# Patient Record
Sex: Male | Born: 1985 | Race: White | Hispanic: No | Marital: Married | Smoking: Former smoker
Health system: Southern US, Community
[De-identification: ages and names within clinical notes are randomized; demographics above are authoritative.]

## PROBLEM LIST (undated history)

## (undated) DIAGNOSIS — F419 Anxiety disorder, unspecified: Secondary | ICD-10-CM

## (undated) HISTORY — DX: Anxiety disorder, unspecified: F41.9

---

## 2014-11-22 ENCOUNTER — Emergency Department: Payer: Self-pay | Admitting: Emergency Medicine

## 2014-11-22 LAB — CBC
HCT: 48 % (ref 40.0–52.0)
HGB: 16.5 g/dL (ref 13.0–18.0)
MCH: 31.2 pg (ref 26.0–34.0)
MCHC: 34.4 g/dL (ref 32.0–36.0)
MCV: 91 fL (ref 80–100)
PLATELETS: 246 10*3/uL (ref 150–440)
RBC: 5.3 10*6/uL (ref 4.40–5.90)
RDW: 13 % (ref 11.5–14.5)
WBC: 8.8 10*3/uL (ref 3.8–10.6)

## 2014-11-22 LAB — COMPREHENSIVE METABOLIC PANEL
ALBUMIN: 3.9 g/dL (ref 3.4–5.0)
Alkaline Phosphatase: 95 U/L
Anion Gap: 6 — ABNORMAL LOW (ref 7–16)
BUN: 10 mg/dL (ref 7–18)
Bilirubin,Total: 0.8 mg/dL (ref 0.2–1.0)
CALCIUM: 8.4 mg/dL — AB (ref 8.5–10.1)
CO2: 24 mmol/L (ref 21–32)
Chloride: 109 mmol/L — ABNORMAL HIGH (ref 98–107)
Creatinine: 1 mg/dL (ref 0.60–1.30)
EGFR (African American): >60
EGFR (Non-African Amer.): >60
GLUCOSE: 98 mg/dL (ref 65–99)
Osmolality: 277 (ref 275–301)
POTASSIUM: 4.1 mmol/L (ref 3.5–5.1)
SGOT(AST): 22 U/L (ref 15–37)
SGPT (ALT): 26 U/L
SODIUM: 139 mmol/L (ref 136–145)
Total Protein: 6.8 g/dL (ref 6.4–8.2)

## 2014-11-22 LAB — DRUG SCREEN, URINE
Amphetamines, Ur Screen: NEGATIVE (ref ?–1000)
BARBITURATES, UR SCREEN: NEGATIVE (ref ?–200)
Benzodiazepine, Ur Scrn: POSITIVE (ref ?–200)
Cannabinoid 50 Ng, Ur ~~LOC~~: POSITIVE (ref ?–50)
Cocaine Metabolite,Ur ~~LOC~~: NEGATIVE (ref ?–300)
MDMA (ECSTASY) UR SCREEN: NEGATIVE (ref ?–500)
Methadone, Ur Screen: NEGATIVE (ref ?–300)
Opiate, Ur Screen: NEGATIVE (ref ?–300)
PHENCYCLIDINE (PCP) UR S: NEGATIVE (ref ?–25)
Tricyclic, Ur Screen: NEGATIVE (ref ?–1000)

## 2014-11-22 LAB — URINALYSIS, COMPLETE
BILIRUBIN, UR: NEGATIVE
BLOOD: NEGATIVE
Bacteria: NONE SEEN
GLUCOSE, UR: NEGATIVE mg/dL (ref 0–75)
KETONE: NEGATIVE
Leukocyte Esterase: NEGATIVE
NITRITE: NEGATIVE
Ph: 6 (ref 4.5–8.0)
Protein: NEGATIVE
RBC,UR: NONE SEEN /HPF (ref 0–5)
SPECIFIC GRAVITY: 1.019 (ref 1.003–1.030)
WBC UR: <1 /HPF (ref 0–5)

## 2016-03-25 IMAGING — CT CT HEAD WITHOUT CONTRAST
1 series · 16 of 30 positions shown, 20 images · non-contrast
Comparison: None.

CLINICAL DATA: Changes in memory, generalized weakness, tremors.
Lethargic speech.

EXAM:
CT HEAD WITHOUT CONTRAST
TECHNIQUE: Contiguous axial images were obtained from the base of the skull
through the vertex without intravenous contrast.

[Series 2: head wo · axial · 0.42mm/px · z∈[+1214,+1358]mm · 16 of 36 slices shown, 20 images]
[im 2/36  brain]
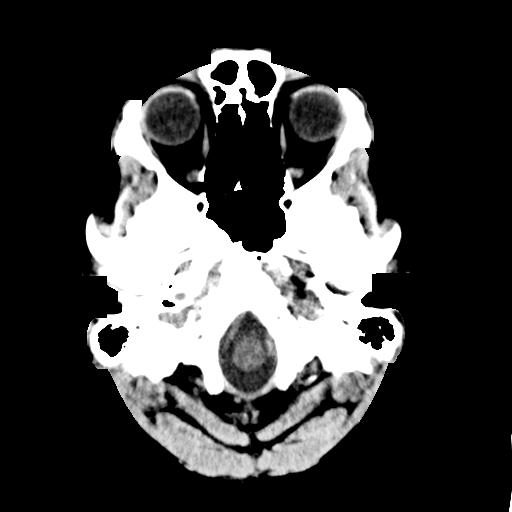
[im 2/36  bone]
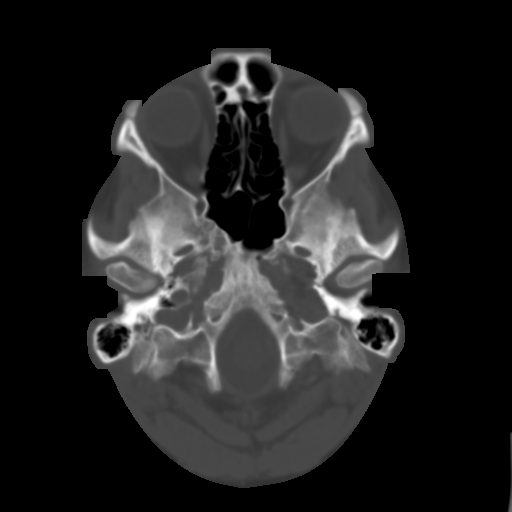
[im 4/36  brain]
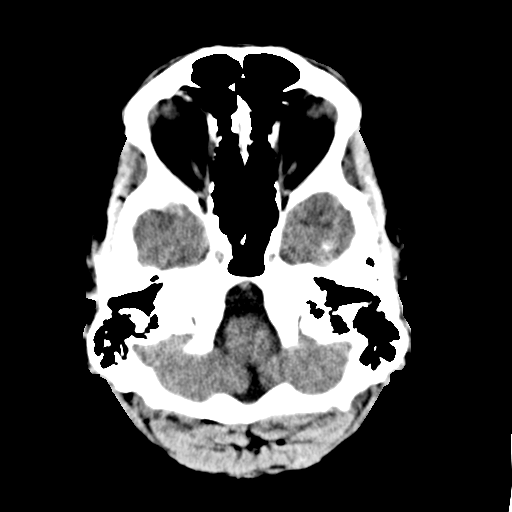
[im 7/36  brain]
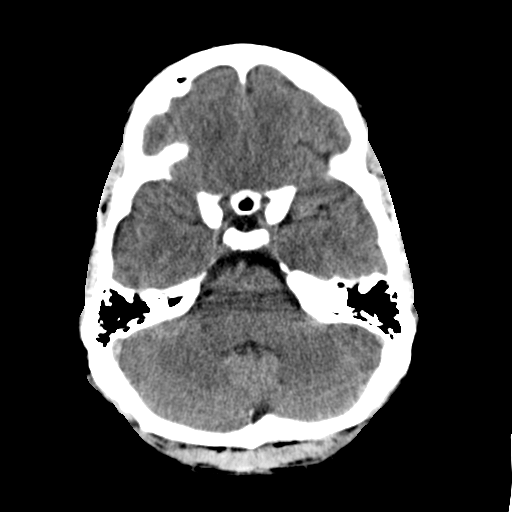
[im 9/36  brain]
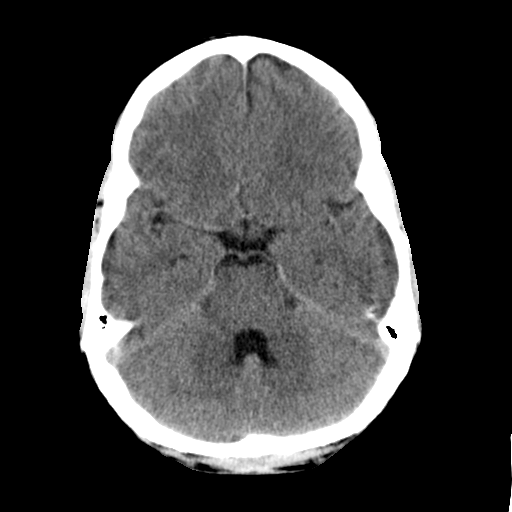
[im 10/36  brain]
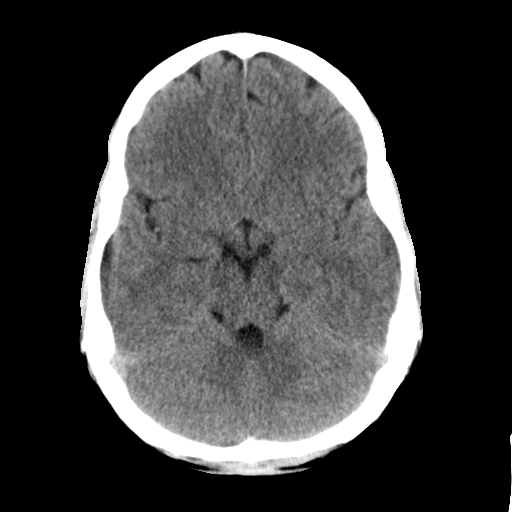
[im 10/36  bone]
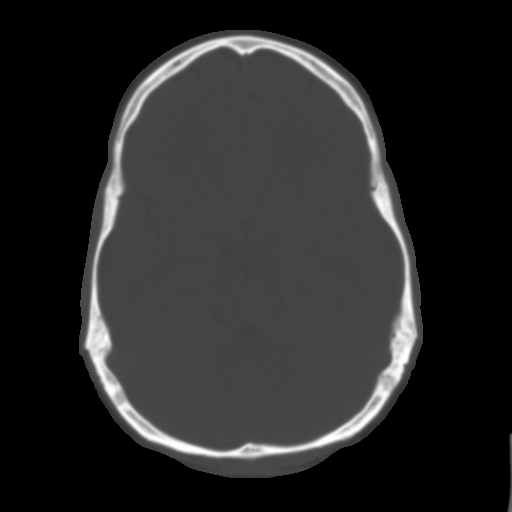
[im 13/36  brain]
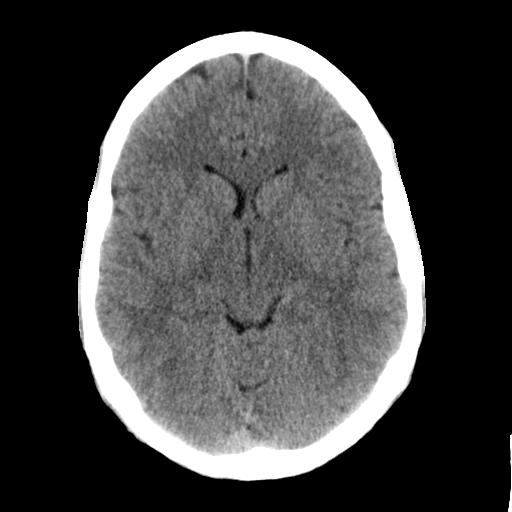
[im 15/36  brain]
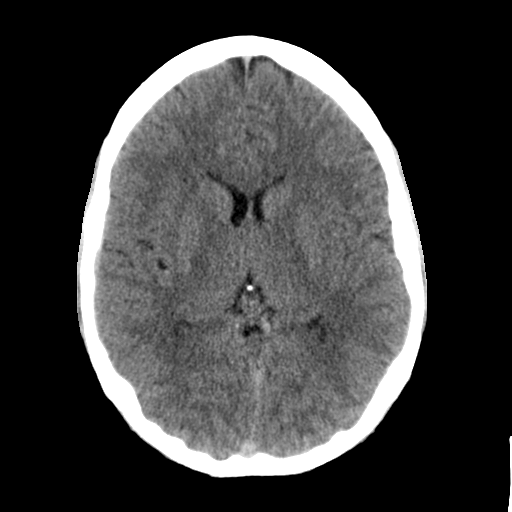
[im 17/36  brain]
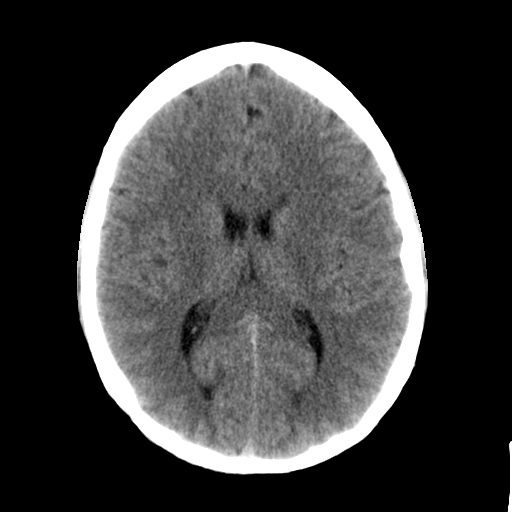
[im 19/36  brain]
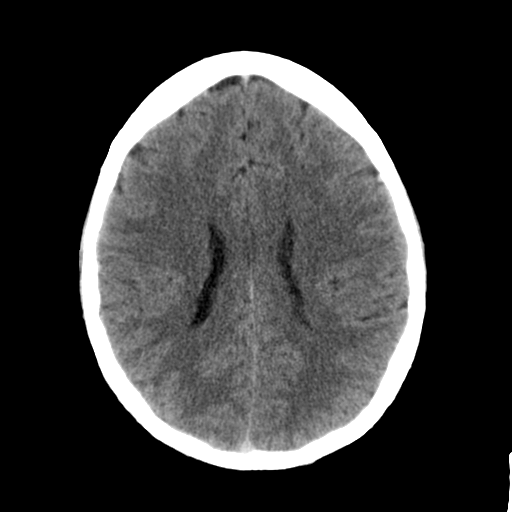
[im 19/36  bone]
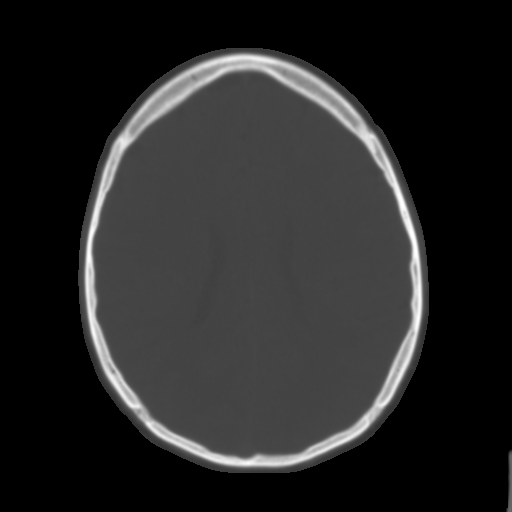
[im 21/36  brain]
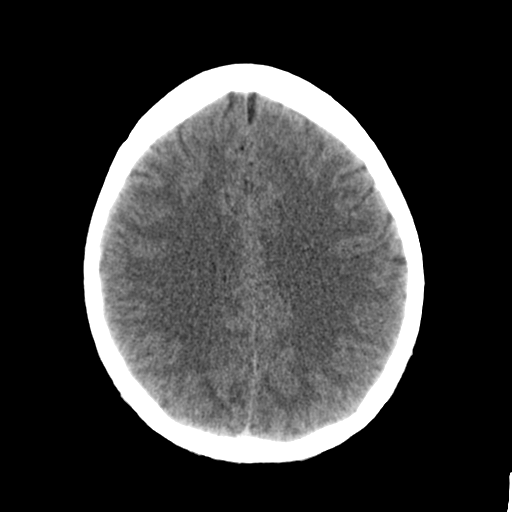
[im 23/36  brain]
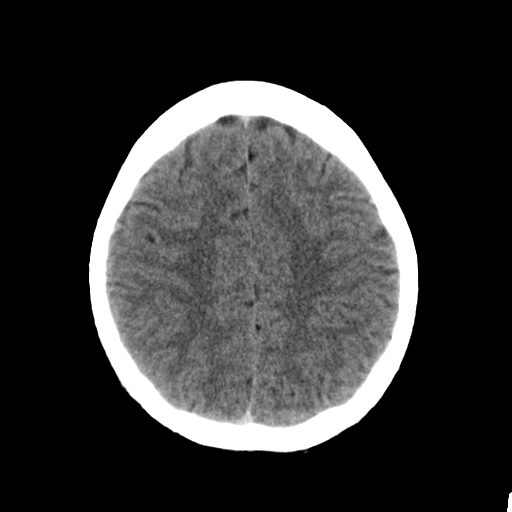
[im 26/36  brain]
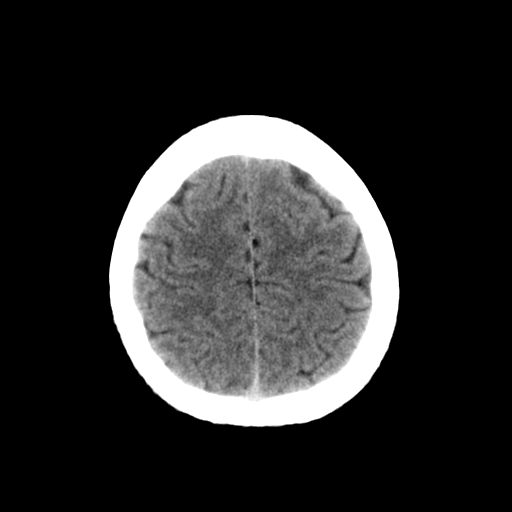
[im 27/36  brain]
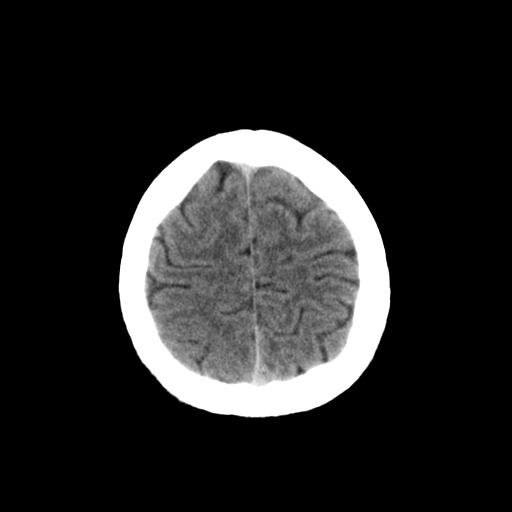
[im 27/36  bone]
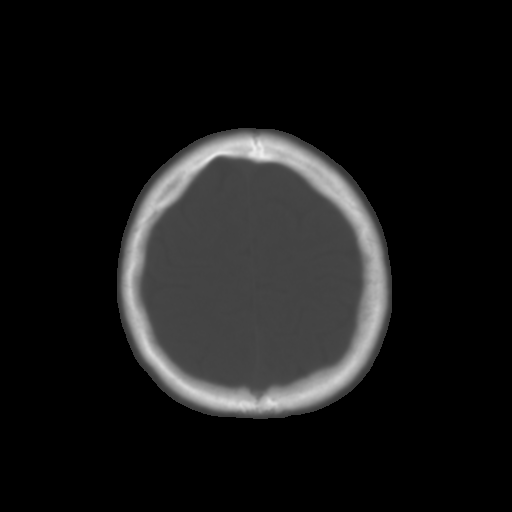
[im 29/36  brain]
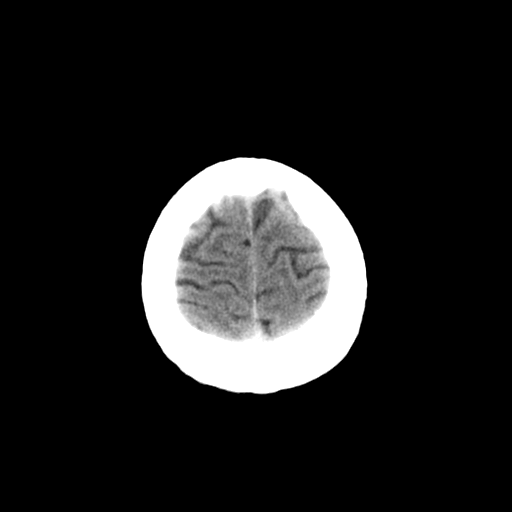
[im 32/36  brain]
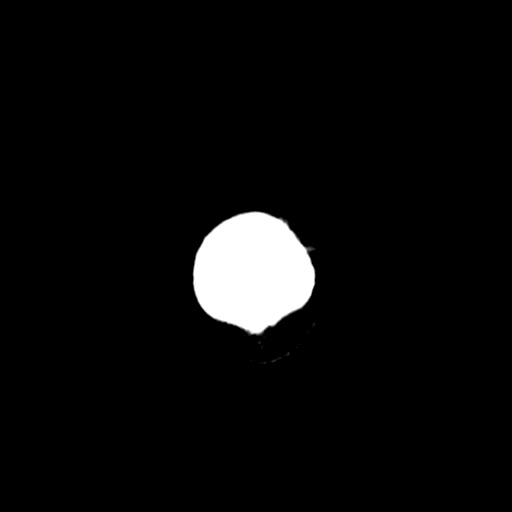
[im 34/36  brain]
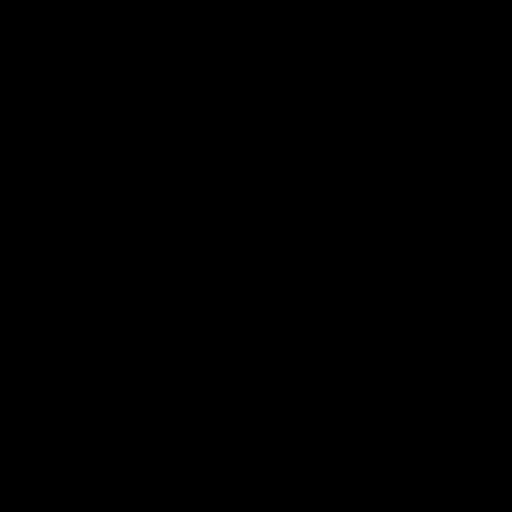

[16 of 30 positions shown; findings below may reference images not displayed]

FINDINGS: Increased attenuation of the intravascular compartment can be seen
with dehydration. No evidence of an acute infarct, acute hemorrhage,
mass lesion, mass effect or hydrocephalus. Visualized portions of
the paranasal sinuses and mastoid air cells are clear.
IMPRESSION: Negative.

## 2020-03-17 ENCOUNTER — Ambulatory Visit: Payer: Self-pay | Attending: Internal Medicine

## 2020-03-17 DIAGNOSIS — Z23 Encounter for immunization: Secondary | ICD-10-CM

## 2020-03-17 NOTE — Progress Notes (Signed)
   Covid-19 Vaccination Clinic  Name:  Scott Duke    MRN: [id number] DOB: 1986/02/12  03/17/2020  Mr. [name] was observed post Covid-19 immunization for 15 minutes without incident. He was provided with Vaccine Information Sheet and instruction to access the V-Safe system.   Mr. [name] was instructed to call 911 with any severe reactions post vaccine: Marland Kitchen Difficulty breathing  . Swelling of face and throat  . A fast heartbeat  . A bad rash all over body  . Dizziness and weakness   Immunizations Administered    Name Date Dose VIS Date Route   Pfizer COVID-19 Vaccine 03/17/2020 10:06 AM 0.3 mL 11/18/2019 Intramuscular   Manufacturer: [employer], [employer]   Lot: [id number]   NDC: [address]-2

## 2020-04-10 ENCOUNTER — Ambulatory Visit: Payer: Self-pay | Attending: Internal Medicine

## 2020-04-10 DIAGNOSIS — Z23 Encounter for immunization: Secondary | ICD-10-CM

## 2020-04-10 NOTE — Progress Notes (Signed)
   Covid-19 Vaccination Clinic  Name:  Danen Lapaglia    MRN: [id number] DOB: 29-Jul-1986  04/10/2020  Mr. Sowle was observed post Covid-19 immunization for 15 minutes without incident. He was provided with Vaccine Information Sheet and instruction to access the V-Safe system.   Mr. Karwowski was instructed to call 911 with any severe reactions post vaccine: Marland Kitchen Difficulty breathing  . Swelling of face and throat  . A fast heartbeat  . A bad rash all over body  . Dizziness and weakness   Immunizations Administered    Name Date Dose VIS Date Route   Pfizer COVID-19 Vaccine 04/10/2020  3:44 PM 0.3 mL 02/01/2019 Intramuscular   Manufacturer: [employer], [employer]   Lot: [id number]   NDC: [address]-3

## 2020-07-08 HISTORY — PX: VASECTOMY: SHX75

## 2020-08-22 ENCOUNTER — Encounter: Payer: Self-pay | Admitting: Nurse Practitioner

## 2020-08-22 ENCOUNTER — Other Ambulatory Visit: Payer: Self-pay

## 2020-08-22 ENCOUNTER — Ambulatory Visit (INDEPENDENT_AMBULATORY_CARE_PROVIDER_SITE_OTHER): Payer: Commercial Managed Care - PPO | Admitting: Nurse Practitioner

## 2020-08-22 VITALS — BP 134/80 | HR 90 | Temp 98.5°F | Ht 69.0 in | Wt 226.0 lb

## 2020-08-22 DIAGNOSIS — E6609 Other obesity due to excess calories: Secondary | ICD-10-CM

## 2020-08-22 DIAGNOSIS — R079 Chest pain, unspecified: Secondary | ICD-10-CM | POA: Diagnosis not present

## 2020-08-22 DIAGNOSIS — Z0000 Encounter for general adult medical examination without abnormal findings: Secondary | ICD-10-CM

## 2020-08-22 DIAGNOSIS — Z6833 Body mass index (BMI) 33.0-33.9, adult: Secondary | ICD-10-CM

## 2020-08-22 DIAGNOSIS — F419 Anxiety disorder, unspecified: Secondary | ICD-10-CM

## 2020-08-22 DIAGNOSIS — R030 Elevated blood-pressure reading, without diagnosis of hypertension: Secondary | ICD-10-CM

## 2020-08-22 DIAGNOSIS — Z114 Encounter for screening for human immunodeficiency virus [HIV]: Secondary | ICD-10-CM

## 2020-08-22 DIAGNOSIS — Z1159 Encounter for screening for other viral diseases: Secondary | ICD-10-CM

## 2020-08-22 DIAGNOSIS — E559 Vitamin D deficiency, unspecified: Secondary | ICD-10-CM

## 2020-08-22 LAB — B12 AND FOLATE PANEL
Folate: 9.5 ng/mL (ref >5.9)
Vitamin B-12: 326 pg/mL (ref 211–911)

## 2020-08-22 LAB — VITAMIN D 25 HYDROXY (VIT D DEFICIENCY, FRACTURES): VITD: 11.48 ng/mL — ABNORMAL LOW (ref 30.00–100.00)

## 2020-08-22 LAB — CBC WITH DIFFERENTIAL/PLATELET
Basophils Absolute: 0.1 10*3/uL (ref 0.0–0.1)
Basophils Relative: 0.8 % (ref 0.0–3.0)
Eosinophils Absolute: 0.2 10*3/uL (ref 0.0–0.7)
Eosinophils Relative: 1.9 % (ref 0.0–5.0)
HCT: 46.6 % (ref 39.0–52.0)
Hemoglobin: 16.2 g/dL (ref 13.0–17.0)
Lymphocytes Relative: 25.3 % (ref 12.0–46.0)
Lymphs Abs: 2.3 10*3/uL (ref 0.7–4.0)
MCHC: 34.8 g/dL (ref 30.0–36.0)
MCV: 88.7 fl (ref 78.0–100.0)
Monocytes Absolute: 0.7 10*3/uL (ref 0.1–1.0)
Monocytes Relative: 8 % (ref 3.0–12.0)
Neutro Abs: 5.8 10*3/uL (ref 1.4–7.7)
Neutrophils Relative %: 64 % (ref 43.0–77.0)
Platelets: 302 10*3/uL (ref 150.0–400.0)
RBC: 5.25 Mil/uL (ref 4.22–5.81)
RDW: 13.1 % (ref 11.5–15.5)
WBC: 9 10*3/uL (ref 4.0–10.5)

## 2020-08-22 LAB — COMPREHENSIVE METABOLIC PANEL
ALT: 29 U/L (ref 0–53)
AST: 18 U/L (ref 0–37)
Albumin: 4.7 g/dL (ref 3.5–5.2)
Alkaline Phosphatase: 117 U/L (ref 39–117)
BUN: 10 mg/dL (ref 6–23)
CO2: 26 mEq/L (ref 19–32)
Calcium: 9.4 mg/dL (ref 8.4–10.5)
Chloride: 106 mEq/L (ref 96–112)
Creatinine, Ser: 1.04 mg/dL (ref 0.40–1.50)
GFR: 82.61 mL/min (ref >60.00)
Glucose, Bld: 83 mg/dL (ref 70–99)
Potassium: 4.1 mEq/L (ref 3.5–5.1)
Sodium: 141 mEq/L (ref 135–145)
Total Bilirubin: 1 mg/dL (ref 0.2–1.2)
Total Protein: 6.8 g/dL (ref 6.0–8.3)

## 2020-08-22 LAB — LIPID PANEL
Cholesterol: 209 mg/dL — ABNORMAL HIGH (ref 0–200)
HDL: 36.8 mg/dL — ABNORMAL LOW (ref >39.00)
NonHDL: 172.44
Total CHOL/HDL Ratio: 6
Triglycerides: 396 mg/dL — ABNORMAL HIGH (ref 0.0–149.0)
VLDL: 79.2 mg/dL — ABNORMAL HIGH (ref 0.0–40.0)

## 2020-08-22 LAB — LDL CHOLESTEROL, DIRECT: Direct LDL: 117 mg/dL

## 2020-08-22 LAB — TSH: TSH: 1.7 u[IU]/mL (ref 0.35–4.50)

## 2020-08-22 LAB — HEMOGLOBIN A1C: Hgb A1c MFr Bld: 5.2 % (ref 4.6–6.5)

## 2020-08-22 NOTE — Progress Notes (Signed)
New Patient Office Visit  Subjective:  Patient ID: Scott Duke, male    DOB: 1986-11-25  Age: 34 y.o. MRN: [id number]  CC:  Chief Complaint  Patient presents with  . New Patient (Initial Visit)    establish care    HPI Scott Duke is a 34 year old who presents to establish care with primary care provider.  He reports he has not has CPE as an adult. He is here by the encouragement of his wife who has been checking his blood pressure at home has been finding it is elevated.  Patient does not know what his blood pressure readings are at home. He reports he had a mild headache for a week which prompted the checks.  He has never been diagnosed with high blood pressure but he does not come to the doctor to know whether it has been elevated or not.  He is trying to take better care of himself.  He quit smoking in July.  BP Readings from Last 3 Encounters:  08/22/20 134/80   Left inferior rib chest pain: He took his wife on a kayaking trip on Sunday-8/12. He  was doing all of the rowing for hours.  He is not used to rowing and he thinks he may have aggravated a muscle in this chest area.  He felt left lower chest rib area discomfort on Monday night, described as a dull ache rated 4 out of 10.  It did not radiate up into the chest or into the back.  He could find any triggers for it and was not positional. He did not take any medication for it. He felt tired Monday and Tuesday.  He had no fevers or chills or cough.  He may have little shortness of breath.  No wheezing.  No exertional component to this.  He works in a [employer] and does heavy lifting and walking all day long and had no problems at work.  No dizziness, lightheadedness, DOE, calf pain or tenderness.  CP resolved after Tues and he has no chest pain now.  He denies shortness of breath or DOE now.  He reports no GI complaints such as heartburn or indigestion.  Anxiety: He has been experiencing irritation at work with stressors.  He has 2 young  children, a 23-1/2-year-old and a 67-month-old and is getting used to being a parent.  He is seeing psychiatry, Dr. Evelene Croon now.  Remote use of Xanax 1 mg and Klonopin in the past.  He  currently presents on no medication. PHQ-9: 2, GAD-7: 10.  No SI or HI.  Tobacco history: Former smoker and quit recently July 20 21 cigarettes.  He vaped in the past. Negative alcohol or illicit drugs.     BMI 33/Obesity:  Wt Readings from Last 3 Encounters:  08/22/20 226 lb (102.5 kg)    History reviewed. No pertinent past medical history.  Past Surgical History:  Procedure Laterality Date  . VASECTOMY  07/2020    Family History  Problem Relation Age of Onset  . Alcohol abuse Mother   . Depression Mother   . Alcohol abuse Father   . Depression Father   . Early death Father   . Mental illness Father   . Depression Sister   . Mental illness Sister   . Depression Brother   . Mental illness Brother   . COPD Maternal Grandmother   . Heart attack Maternal Grandfather     Social History   Socioeconomic History  . Marital status: Married  Spouse name: Not on file  . Number of children: Not on file  . Years of education: Not on file  . Highest education level: High school graduate  Occupational History  . Not on file  Tobacco Use  . Smoking status: Former Smoker    Types: Cigarettes    Quit date: 06/07/2020    Years since quitting: 0.2  . Smokeless tobacco: Never Used  Vaping Use  . Vaping Use: Former  Substance and Sexual Activity  . Alcohol use: Yes  . Drug use: Never  . Sexual activity: Yes    Partners: Female  Other Topics Concern  . Not on file  Social History Narrative   Married works in a [employer]- physical job, 2 young children   Social Determinants of Corporate investment banker Strain:   . Difficulty of Paying Living Expenses: Not on file  Food Insecurity:   . Worried About Programme researcher, broadcasting/film/video in the Last Year: Not on file  . Ran Out of Food in the Last Year: Not on  file  Transportation Needs:   . Lack of Transportation (Medical): Not on file  . Lack of Transportation (Non-Medical): Not on file  Physical Activity:   . Days of Exercise per Week: Not on file  . Minutes of Exercise per Session: Not on file  Stress:   . Feeling of Stress : Not on file  Social Connections:   . Frequency of Communication with Friends and Family: Not on file  . Frequency of Social Gatherings with Friends and Family: Not on file  . Attends Religious Services: Not on file  . Active Member of Clubs or Organizations: Not on file  . Attends Banker Meetings: Not on file  . Marital Status: Not on file  Intimate Partner Violence:   . Fear of Current or Ex-Partner: Not on file  . Emotionally Abused: Not on file  . Physically Abused: Not on file  . Sexually Abused: Not on file    Review of Systems Pertinent positives none history of present illness otherwise negative.  Objective:   Today's Vitals: BP 134/80 (BP Location: Left Arm, Patient Position: Sitting, Cuff Size: Normal)   Pulse 90   Temp 98.5 F (36.9 C) (Oral)   Ht 5\' 9"  (1.753 m)   Wt 226 lb (102.5 kg)   SpO2 98%   BMI 33.37 kg/m   Physical Exam Vitals reviewed.  Constitutional:      Appearance: He is obese.  HENT:     Head: Normocephalic and atraumatic.  Eyes:     Conjunctiva/sclera: Conjunctivae normal.     Pupils: Pupils are equal, round, and reactive to light.  Cardiovascular:     Rate and Rhythm: Normal rate and regular rhythm.     Pulses: Normal pulses.     Heart sounds: Normal heart sounds.  Pulmonary:     Effort: Pulmonary effort is normal.     Breath sounds: Normal breath sounds.  Chest:     Chest wall: No mass, deformity or tenderness.     Comments: Cannot reproduce his chest discomfort with palpation. Twisting motion  Abdominal:     Palpations: Abdomen is soft.     Tenderness: There is no abdominal tenderness.  Musculoskeletal:        General: Normal range of motion.      Cervical back: Normal range of motion and neck supple.  Skin:    General: Skin is warm and dry.  Neurological:  General: No focal deficit present.     Mental Status: He is alert and oriented to person, place, and time.  Psychiatric:        Mood and Affect: Mood normal.        Behavior: Behavior normal.        Thought Content: Thought content normal.        Judgment: Judgment normal.     Assessment & Plan:   Problem List Items Addressed This Visit      Other   Chest pain - Primary   Relevant Orders   EKG 12-Lead (Completed)   Elevated BP without diagnosis of hypertension   Class 1 obesity due to excess calories with body mass index (BMI) of 33.0 to 33.9 in adult   Relevant Orders   CBC with Differential/Platelet (Completed)   TSH (Completed)   Comprehensive metabolic panel (Completed)   Hemoglobin A1c (Completed)   Lipid panel (Completed)   Anxiety   Relevant Orders   VITAMIN D 25 Hydroxy (Vit-D Deficiency, Fractures) (Completed)   B12 and Folate Panel (Completed)   Encounter for HCV screening test for low risk patient   Relevant Orders   Hepatitis C antibody (Completed)      No outpatient encounter medications on file as of 08/22/2020.   No facility-administered encounter medications on file as of 08/22/2020.   EKG was performed today because of his report of chest pain and mild SOB.  The EKG was read by me and reviewed by Dr.Scott:  showed sinus rhythm with ventricular rate of 76.  PR: 134 ms, QT 360 ms, QTC P: 405 ms T wave inversion in lead III, flattened T waves in aVF, nonspecific ST-T wave changes.   Patient advised:  Please go to the lab today for routine laboratory studies.Great job quitting smoking in July!  Please spot check your blood pressure couple times a week and record.  You can bring those into the next office visit.  A diagnosis of hypertension is made when there is persistent elevation in the blood pressure. Check your blood pressure with both  feet on the floor, awake 30 minutes after eating, have a quiet time of 3 to 5 minutes before you check the blood pressure.   You may take Tylenol as needed for the left-sided chest pain.  Avoid ibuprofen, Advil, Aleve, BC daily, Motrin.  This is called the nonsteroidal anti-inflammatory class of medicines and they are known to elevate the blood pressure and harm the kidneys if taken on a regular basis 6 or more times per month  Follow-up with your psychiatrist as you have planned for anxiety.  Follow-up: Return in about 4 weeks (around 09/19/2020).  This visit occurred during the SARS-CoV-2 public health emergency.  Safety protocols were in place, including screening questions prior to the visit, additional usage of staff PPE, and extensive cleaning of exam room while observing appropriate contact time as indicated for disinfecting solutions.   [name], NP

## 2020-08-22 NOTE — Patient Instructions (Addendum)
Please go to the lab today for routine laboratory studies.Great job quitting smoking in July!  Please spot check your blood pressure couple times a week and record.  You can bring those into the next office visit.  A diagnosis of hypertension is made when there is persistent elevation in the blood pressure. Check your blood pressure with both feet on the floor, awake 30 minutes after eating, have a quiet time of 3 to 5 minutes before you check the blood pressure.   EKG was performed today because of your report of chest pain. You may take Tylenol as needed for the left-sided chest pain.  Avoid ibuprofen, Advil, Aleve, BC daily, Motrin.  This is called the nonsteroidal anti-inflammatory class of medicines and they are known to elevate the blood pressure and harm the kidneys if taken on a regular basis 6 or more times per month  Follow-up with your psychiatrist as you have planned for anxiety.  Please arrange follow-up office visit in 1 month and will do a complete physical exam.  Chest Wall Pain Chest wall pain is pain in or around the bones and muscles of your chest. Sometimes, an injury causes this pain. Excessive coughing or overuse of arm and chest muscles may also cause chest wall pain. Sometimes, the cause may not be known. This pain may take several weeks or longer to get better. Follow these instructions at home: Managing pain, stiffness, and swelling   If directed, put ice on the painful area: ? Put ice in a plastic bag. ? Place a towel between your skin and the bag. ? Leave the ice on for 20 minutes, 2-3 times per day. Activity  Rest as told by your health care provider.  Avoid activities that cause pain. These include any activities that use your chest muscles or your abdominal and side muscles to lift heavy items. Ask your health care provider what activities are safe for you. General instructions   Take over-the-counter and prescription medicines only as told by your health  care provider.  Do not use any products that contain nicotine or tobacco, such as cigarettes, e-cigarettes, and chewing tobacco. These can delay healing after injury. If you need help quitting, ask your health care provider.  Keep all follow-up visits as told by your health care provider. This is important. Contact a health care provider if:  You have a fever.  Your chest pain becomes worse.  You have new symptoms. Get help right away if:  You have nausea or vomiting.  You feel sweaty or light-headed.  You have a cough with mucus from your lungs (sputum) or you cough up blood.  You develop shortness of breath. These symptoms may represent a serious problem that is an emergency. Do not wait to see if the symptoms will go away. Get medical help right away. Call your local emergency services (911 in the [place] Do not drive yourself to the hospital. Summary  Chest wall pain is pain in or around the bones and muscles of your chest.  Depending on the cause, it may be treated with ice, rest, medicines, and avoiding activities that cause pain.  Contact a health care provider if you have a fever, worsening chest pain, or new symptoms.  Get help right away if you feel light-headed or you develop shortness of breath. These symptoms may be an emergency. This information is not intended to replace advice given to you by your health care provider. Make sure you discuss any questions you have  with your health care provider. Document Revised: 05/27/2018 Document Reviewed: 05/27/2018 Elsevier Patient Education  [place].  Preventing Hypertension Hypertension, commonly called high blood pressure, is when the force of blood pumping through the arteries is too strong. Arteries are blood vessels that carry blood from the heart throughout the body. Over time, hypertension can damage the arteries and decrease blood flow to important parts of the body, including the brain, heart, and kidneys.  Often, hypertension does not cause symptoms until blood pressure is very high. For this reason, it is important to have your blood pressure checked on a regular basis. Hypertension can often be prevented with diet and lifestyle changes. If you already have hypertension, you can control it with diet and lifestyle changes, as well as medicine. What nutrition changes can be made? Maintain a healthy diet. This includes:  Eating less salt (sodium). Ask your health care provider how much sodium is safe for you to have. The general recommendation is to consume less than 1 tsp (2,300 mg) of sodium a day. ? Do not add salt to your food. ? Choose low-sodium options when grocery shopping and eating out.  Limiting fats in your diet. You can do this by eating low-fat or fat-free dairy products and by eating less red meat.  Eating more fruits, vegetables, and whole grains. Make a goal to eat: ? 1-2 cups of fresh fruits and vegetables each day. ? 3-4 servings of whole grains each day.  Avoiding foods and beverages that have added sugars.  Eating fish that contain healthy fats (omega-3 fatty acids), such as mackerel or salmon. If you need help putting together a healthy eating plan, try the DASH diet. This diet is high in fruits, vegetables, and whole grains. It is low in sodium, red meat, and added sugars. DASH stands for Dietary Approaches to Stop Hypertension. What lifestyle changes can be made?   Lose weight if you are overweight. Losing just 3?5% of your body weight can help prevent or control hypertension. ? For example, if your present weight is 200 lb (91 kg), a loss of 3-5% of your weight means losing 6-10 lb (2.7-4.5 kg). ? Ask your health care provider to help you with a diet and exercise plan to safely lose weight.  Get enough exercise. Do at least 150 minutes of moderate-intensity exercise each week. ? You could do this in short exercise sessions several times a day, or you could do longer  exercise sessions a few times a week. For example, you could take a brisk 10-minute walk or bike ride, 3 times a day, for 5 days a week.  Find ways to reduce stress, such as exercising, meditating, listening to music, or taking a yoga class. If you need help reducing stress, ask your health care provider.  Do not smoke. This includes e-cigarettes. Chemicals in tobacco and nicotine products raise your blood pressure each time you smoke. If you need help quitting, ask your health care provider.  Avoid alcohol. If you drink alcohol, limit alcohol intake to no more than 1 drink a day for nonpregnant women and 2 drinks a day for men. One drink equals 12 oz of beer, 5 oz of wine, or 1 oz of hard liquor. Why are these changes important? Diet and lifestyle changes can help you prevent hypertension, and they may make you feel better overall and improve your quality of life. If you have hypertension, making these changes will help you control it and help prevent major complications,  such as:  Hardening and narrowing of arteries that supply blood to: ? Your heart. This can cause a heart attack. ? Your brain. This can cause a stroke. ? Your kidneys. This can cause kidney failure.  Stress on your heart muscle, which can cause heart failure. What can I do to lower my risk?  Work with your health care provider to make a hypertension prevention plan that works for you. Follow your plan and keep all follow-up visits as told by your health care provider.  Learn how to check your blood pressure at home. Make sure that you know your personal target blood pressure, as told by your health care provider. How is this treated? In addition to diet and lifestyle changes, your health care provider may recommend medicines to help lower your blood pressure. You may need to try a few different medicines to find what works best for you. You also may need to take more than one medicine. Take over-the-counter and prescription  medicines only as told by your health care provider. Where to find support Your health care provider can help you prevent hypertension and help you keep your blood pressure at a healthy level. Your local hospital or your community may also provide support services and prevention programs. The American Heart Association offers an online support network at: CheapBootlegs.com.cy Where to find more information Learn more about hypertension from:  [place], Lung, and Blood Institute: ElectronicHangman.is  Centers for Disease Control and Prevention: https://ingram.com/  American Academy of Family Physicians: http://familydoctor.org/familydoctor/en/diseases-conditions/high-blood-pressure.printerview.all.html Learn more about the DASH diet from:  [place], Lung, and [place]: https://www.reyes.com/ Contact a health care provider if:  You think you are having a reaction to medicines you have taken.  You have recurrent headaches or feel dizzy.  You have swelling in your ankles.  You have trouble with your vision. Summary  Hypertension often does not cause any symptoms until blood pressure is very high. It is important to get your blood pressure checked regularly.  Diet and lifestyle changes are the most important steps in preventing hypertension.  By keeping your blood pressure in a healthy range, you can prevent complications like heart attack, heart failure, stroke, and kidney failure.  Work with your health care provider to make a hypertension prevention plan that works for you. This information is not intended to replace advice given to you by your health care provider. Make sure you discuss any questions you have with your health care provider. Document Revised: 03/18/2019 Document Reviewed: 08/04/2016 Elsevier Patient Education  2020 [place] With  Anxiety Anxiety disorders are mental health conditions that cause overwhelming feelings of nervousness or worry. These feelings interfere with daily activities and relationships. Anxiety disorders include:  Generalized anxiety disorder (GAD).  Social anxiety.  Post-traumatic stress disorder (PTSD). When a person has an anxiety disorder, his or her condition can affect others around him or her, such as friends and family members. Friends and family can help by offering support and understanding. What do I need to know about this condition? Anxiety is the mental and physical experience of nervousness or worry that you might feel when you think about a stressful event. Occasional anxiety is normal, but a person with an anxiety disorder becomes preoccupied with this worry. He or she may know that the anxiety is not logical, but knowing this does not relieve the discomfort that he or she feels. Anxiety disorders cause a great deal of distress and prevent someone from having a  normal daily life. Someone with an anxiety disorder may:  Experience anxiety that: ? May or may not have a specific trigger. ? Lasts for long periods of time. ? Causes physical problems over time. ? Is far more intense than normal anticipation. ? Occurs at unpredictable times.  Feel restless or edgy.  Get fatigued easily.  Have trouble focusing.  Have muscle tension.  Have trouble falling asleep or staying asleep.  Be irritable and occasionally have sudden expressions of strong feelings (outbursts).  Have worries that do not make sense to you. What do I need to know about the treatment options? Anxiety disorders are generally very treatable by mental health providers such as psychologists, psychiatrists, and clinical social workers. Treatment may include one or more of the following:  Psychotherapy, also called talk therapy or counseling. Types of psychotherapy that are used to treat anxiety include: ? Cognitive  behavioral therapy (CBT). This type of therapy teaches a person how to recognize unhealthy feelings, thoughts, and behaviors, and how to replace those feelings with positive thoughts and actions. ? Behavior therapy that trains a person to relax and self-soothe. This also involves gradually exposing the person to the cause of the anxiety (progressive exposure therapy). ? Biofeedback. This type of therapy focuses on trying to control certain body functions, like heart rate, to lessen the physical impact of anxiety. ? Mindfulness-based stress reduction training. This uses education, meditation, and yoga to help a person stay focused on the present instead of living in the past or worrying about the future. ? Acceptance and commitment therapy (ACT). This helps a person to focus on acceptance, rather than trying to control every situation. ? Family therapy. This treatment helps family members to communicate and deal with conflict in healthy ways.  Medicine to treat anxiety and help to control certain emotions and behaviors.  Mind-body programs. These programs encourage the person with anxiety to be involved in his or her treatment and feel empowered. Mind-body programs may include mindfulness-based stress reduction training, yoga, or tai chi. How can I create a safe environment?  For certain types of anxiety, such as PTSD, you may want to: ? Remove alcohol and prescription medicines from your loved one's home, or limit the amount of these substances in the home. This can help to prevent your loved one from abusing alcohol and prescription medicines. ? Remove or lock up guns and other weapons. If you do not have a safe place to keep a gun, local law enforcement may store a gun for you.  Make a written crisis plan. Include important phone numbers, such as the local crisis intervention team. Make sure that: ? The person with anxiety knows about this plan and agrees with it. ? Everyone who has regular  contact with the person knows about the plan and knows what to do in an emergency. ? The written plan is easily accessible and can be quickly put into action. How should I care for myself? It is important to find ways to care for your body, mind, and well-being while supporting someone with anxiety.  Try to maintain your normal routines. This can help you remember that your life is about more than your loved one's condition.  Understand what your limits are. Say "no" to requests or events that lead to a schedule that is too busy.  Make time for activities that help you relax, and try to not feel guilty about taking time for yourself.  Spend time with friends and family.  Consider  trying meditation and deep breathing exercises to lower your stress. Attend some mind-body classes by yourself or with your loved one.  Get plenty of sleep.  Exercise, even if it is just taking a short walk a few times a week.  If you are struggling emotionally with guilt, fear, or anger, consider working with a therapist. What are some signs that the condition is getting worse? Signs that your loved one's condition may be getting worse include:  Dramatic mood swings.  Staying away from activities that he or she used to enjoy.  Drinking more alcohol than normal.  Either seeming tearful or seeming to lack emotion.  Talking about "not feeling right."  Staying away from others (isolating himself or herself). Where to find support Talk about the condition Good communication is the key to supporting your friend or family member. Here are a few things to keep in mind:  Be careful about too much prodding. Try not to overdo reminders to an adult friend or family member about things like taking medicines. Ask how your loved one prefers that you help.  Ask questions and then listen to your loved one's response. Be available if your friend or family member wants to talk, but give your loved one space if he or  she does not feel like talking.  Never ignore comments about suicide, and do not try to avoid the subject of suicide. Talking about suicide will not make your loved one want to act on it. You or your loved one can reach out 24 hours a day to get free, private support (on the phone or a live online chat) from a suicide crisis helpline, such as the [place] at [phone].  Be encouraging and offer emotional support. This can help to lower stress. Even saying something simple to comfort your loved one may help.  If your loved one is open to it, go with him or her to visits with a counselor or health care provider. Get suggestions directly from your loved one's care providers about when to get help if you are concerned about behavior changes. Privacy laws limit how much a person's health care provider can share with you without your loved one's permission, but if you feel that a situation is an emergency, do not wait to call a health care provider or emergency services. Find support and resources  Consider joining self-help and support groups, not only for your friend or family member, but also for yourself. People in these peer and family support groups understand what you and your loved one are going through. They can help you feel a sense of hope and connect you with local resources to help you learn more. ? You may also consider family therapy. General support  Make an effort to learn all you can about your loved one's form of anxiety.  Include your loved one in activities. Invite him or her to go for walks and outings.  Help your loved one follow his or her treatment plan as directed by health care providers. This could mean driving him or her to therapy sessions or suggesting ways to cope with stress.  Remember that your support really matters. Social support is a huge benefit for someone who is coping with anxiety. Where to find more information A health care  provider may be able to recommend mental health resources that are available online or over the phone. You could start with:  Government sites such as the Substance Abuse and Mental  Health Services Administration (SAMHSA): ktimeonline.com  National mental health organizations such as the [employer] on Mental Illness (NAMI): www.nami.org Get help right away if:  Your loved one expresses thoughts about harming himself or herself or others.  Your loved one's behavior becomes hard to predict (erratic).  Your loved one shows behavior that does not make sense with the current time, place, or circumstances. These behaviors may include seeing, feeling, tasting, or hearing things that are not real (hallucinations) or having flashbacks. If you ever feel like your loved one may hurt himself or herself or others, or may have thoughts about taking his or her own life, get help right away. You can go to your nearest emergency department or call:  Your local emergency services (911 in the [place]  A suicide crisis helpline, such as the [place] at 651 705 7549. This is open 24 hours a day. Summary  People with anxiety disorders experience overwhelming feelings of nervousness or worry. Friends and family can help by offering support and understanding.  Anxiety disorders are generally very treatable by mental health providers. They can be treated with psychotherapy (also known as talk therapy), behavior therapy, medicine, and mind-body programs.  Be compassionate and listen to your loved one. Be available if your friend or family member wants to talk, but give your loved one space if he or she does not feel like talking.  Find ways to care for your own body, mind, and well-being while supporting someone with anxiety. Try to maintain your normal routines and make time to do things that you enjoy. This information is not intended to replace advice given to you by your health  care provider. Make sure you discuss any questions you have with your health care provider. Document Revised: 03/17/2019 Document Reviewed: 04/07/2017 Elsevier Patient Education  [place].  Hypertension, Adult High blood pressure (hypertension) is when the force of blood pumping through the arteries is too strong. The arteries are the blood vessels that carry blood from the heart throughout the body. Hypertension forces the heart to work harder to pump blood and may cause arteries to become narrow or stiff. Untreated or uncontrolled hypertension can cause a heart attack, heart failure, a stroke, kidney disease, and other problems. A blood pressure reading consists of a higher number over a lower number. Ideally, your blood pressure should be below 120/80. The first ("top") number is called the systolic pressure. It is a measure of the pressure in your arteries as your heart beats. The second ("bottom") number is called the diastolic pressure. It is a measure of the pressure in your arteries as the heart relaxes. What are the causes? The exact cause of this condition is not known. There are some conditions that result in or are related to high blood pressure. What increases the risk? Some risk factors for high blood pressure are under your control. The following factors may make you more likely to develop this condition:  Smoking.  Having type 2 diabetes mellitus, high cholesterol, or both.  Not getting enough exercise or physical activity.  Being overweight.  Having too much fat, sugar, calories, or salt (sodium) in your diet.  Drinking too much alcohol. Some risk factors for high blood pressure may be difficult or impossible to change. Some of these factors include:  Having chronic kidney disease.  Having a family history of high blood pressure.  Age. Risk increases with age.  Race. You may be at higher risk if you are African  American.  Gender. Men are at higher risk than  women before age 85. After age 40, women are at higher risk than men.  Having obstructive sleep apnea.  Stress. What are the signs or symptoms? High blood pressure may not cause symptoms. Very high blood pressure (hypertensive crisis) may cause:  Headache.  Anxiety.  Shortness of breath.  Nosebleed.  Nausea and vomiting.  Vision changes.  Severe chest pain.  Seizures. How is this diagnosed? This condition is diagnosed by measuring your blood pressure while you are seated, with your arm resting on a flat surface, your legs uncrossed, and your feet flat on the floor. The cuff of the blood pressure monitor will be placed directly against the skin of your upper arm at the level of your heart. It should be measured at least twice using the same arm. Certain conditions can cause a difference in blood pressure between your right and left arms. Certain factors can cause blood pressure readings to be lower or higher than normal for a short period of time:  When your blood pressure is higher when you are in a health care provider's office than when you are at home, this is called white coat hypertension. Most people with this condition do not need medicines.  When your blood pressure is higher at home than when you are in a health care provider's office, this is called masked hypertension. Most people with this condition may need medicines to control blood pressure. If you have a high blood pressure reading during one visit or you have normal blood pressure with other risk factors, you may be asked to:  Return on a different day to have your blood pressure checked again.  Monitor your blood pressure at home for 1 week or longer. If you are diagnosed with hypertension, you may have other blood or imaging tests to help your health care provider understand your overall risk for other conditions. How is this treated? This condition is treated by making healthy lifestyle changes, such as eating  healthy foods, exercising more, and reducing your alcohol intake. Your health care provider may prescribe medicine if lifestyle changes are not enough to get your blood pressure under control, and if:  Your systolic blood pressure is above 130.  Your diastolic blood pressure is above 80. Your personal target blood pressure may vary depending on your medical conditions, your age, and other factors. Follow these instructions at home: Eating and drinking   Eat a diet that is high in fiber and potassium, and low in sodium, added sugar, and fat. An example eating plan is called the DASH (Dietary Approaches to Stop Hypertension) diet. To eat this way: ? Eat plenty of fresh fruits and vegetables. Try to fill one half of your plate at each meal with fruits and vegetables. ? Eat whole grains, such as whole-wheat pasta, brown rice, or whole-grain bread. Fill about one fourth of your plate with whole grains. ? Eat or drink low-fat dairy products, such as skim milk or low-fat yogurt. ? Avoid fatty cuts of meat, processed or cured meats, and poultry with skin. Fill about one fourth of your plate with lean proteins, such as fish, chicken without skin, beans, eggs, or tofu. ? Avoid pre-made and processed foods. These tend to be higher in sodium, added sugar, and fat.  Reduce your daily sodium intake. Most people with hypertension should eat less than 1,500 mg of sodium a day.  Do not drink alcohol if: ? Your health care provider  tells you not to drink. ? You are pregnant, may be pregnant, or are planning to become pregnant.  If you drink alcohol: ? Limit how much you use to:  0-1 drink a day for women.  0-2 drinks a day for men. ? Be aware of how much alcohol is in your drink. In the [place] one drink equals one 12 oz bottle of beer (355 mL), one 5 oz glass of wine (148 mL), or one 1 oz glass of hard liquor (44 mL). Lifestyle   Work with your health care provider to maintain a healthy body weight or  to lose weight. Ask what an ideal weight is for you.  Get at least 30 minutes of exercise most days of the week. Activities may include walking, swimming, or biking.  Include exercise to strengthen your muscles (resistance exercise), such as Pilates or lifting weights, as part of your weekly exercise routine. Try to do these types of exercises for 30 minutes at least 3 days a week.  Do not use any products that contain nicotine or tobacco, such as cigarettes, e-cigarettes, and chewing tobacco. If you need help quitting, ask your health care provider.  Monitor your blood pressure at home as told by your health care provider.  Keep all follow-up visits as told by your health care provider. This is important. Medicines  Take over-the-counter and prescription medicines only as told by your health care provider. Follow directions carefully. Blood pressure medicines must be taken as prescribed.  Do not skip doses of blood pressure medicine. Doing this puts you at risk for problems and can make the medicine less effective.  Ask your health care provider about side effects or reactions to medicines that you should watch for. Contact a health care provider if you:  Think you are having a reaction to a medicine you are taking.  Have headaches that keep coming back (recurring).  Feel dizzy.  Have swelling in your ankles.  Have trouble with your vision. Get help right away if you:  Develop a severe headache or confusion.  Have unusual weakness or numbness.  Feel faint.  Have severe pain in your chest or abdomen.  Vomit repeatedly.  Have trouble breathing. Summary  Hypertension is when the force of blood pumping through your arteries is too strong. If this condition is not controlled, it may put you at risk for serious complications.  Your personal target blood pressure may vary depending on your medical conditions, your age, and other factors. For most people, a normal blood  pressure is less than 120/80.  Hypertension is treated with lifestyle changes, medicines, or a combination of both. Lifestyle changes include losing weight, eating a healthy, low-sodium diet, exercising more, and limiting alcohol. This information is not intended to replace advice given to you by your health care provider. Make sure you discuss any questions you have with your health care provider. Document Revised: 08/04/2018 Document Reviewed: 08/04/2018 Elsevier Patient Education  2020 [employer].

## 2020-08-23 LAB — HEPATITIS C ANTIBODY
Hepatitis C Ab: NONREACTIVE
SIGNAL TO CUT-OFF: 0.01 (ref <1.00)

## 2020-08-23 LAB — HIV ANTIBODY (ROUTINE TESTING W REFLEX): HIV 1&2 Ab, 4th Generation: NONREACTIVE

## 2020-08-26 ENCOUNTER — Encounter: Payer: Self-pay | Admitting: Nurse Practitioner

## 2020-08-26 MED ORDER — VITAMIN D (ERGOCALCIFEROL) 1.25 MG (50000 UNIT) PO CAPS: 50000.0000 [IU] | ORAL_CAPSULE | ORAL | 0 refills | Status: DC

## 2020-08-28 NOTE — Addendum Note (Signed)
Addended by: [name] A on: 08/28/2020 10:28 PM   Modules accepted: Orders

## 2020-09-21 ENCOUNTER — Encounter: Payer: Commercial Managed Care - PPO | Admitting: Nurse Practitioner

## 2020-11-06 ENCOUNTER — Telehealth: Payer: Self-pay | Admitting: Nurse Practitioner

## 2020-11-06 NOTE — Telephone Encounter (Signed)
He did not complete labs as ordered in Sept. for f/up very low Vit d level. Is he taking Vit D supplement? . Did he get to Cardiology? He did not return for follow up. Is he established elsewhere?

## 2020-11-07 NOTE — Telephone Encounter (Signed)
Called and spoke to [place]. I tried to give him the number to [facility] so that he can call and establish care, he declined the number and did not want to call.

## 2020-11-07 NOTE — Telephone Encounter (Signed)
Please advise he can establish with a new provider at another [place] office and should.

## 2020-11-07 NOTE — Telephone Encounter (Signed)
Called and spoke to [employer]. He states that he is still a patient of [name] and that he is taking his Vit D Supplement. He states that he did not go to Cardiology. I have informed him of [name] last day and how she is no longer practicing at our office. Scott Duke Verbalized understanding and had no further questions.

## 2021-12-24 ENCOUNTER — Other Ambulatory Visit (HOSPITAL_COMMUNITY): Payer: Self-pay

## 2021-12-24 MED ORDER — AMPHETAMINE-DEXTROAMPHETAMINE 20 MG PO TABS
20.0000 mg | ORAL_TABLET | Freq: Three times a day (TID) | ORAL | 0 refills | Status: AC
  Filled 2021-12-24: qty 90, 30d supply, fill #0

## 2022-05-29 ENCOUNTER — Ambulatory Visit: Payer: Self-pay | Admitting: Nurse Practitioner

## 2022-06-05 ENCOUNTER — Encounter: Payer: Self-pay | Admitting: Nurse Practitioner

## 2022-06-05 ENCOUNTER — Ambulatory Visit (INDEPENDENT_AMBULATORY_CARE_PROVIDER_SITE_OTHER): Payer: Commercial Managed Care - PPO | Admitting: Nurse Practitioner

## 2022-06-05 ENCOUNTER — Encounter (INDEPENDENT_AMBULATORY_CARE_PROVIDER_SITE_OTHER): Payer: Self-pay

## 2022-06-05 VITALS — BP 134/86 | HR 90 | Temp 98.5°F | Resp 16 | Ht 69.0 in | Wt 217.5 lb

## 2022-06-05 DIAGNOSIS — F900 Attention-deficit hyperactivity disorder, predominantly inattentive type: Secondary | ICD-10-CM | POA: Diagnosis not present

## 2022-06-05 DIAGNOSIS — Z23 Encounter for immunization: Secondary | ICD-10-CM | POA: Diagnosis not present

## 2022-06-05 DIAGNOSIS — Z130 Encounter for screening for diseases of the blood and blood-forming organs and certain disorders involving the immune mechanism: Secondary | ICD-10-CM

## 2022-06-05 DIAGNOSIS — E782 Mixed hyperlipidemia: Secondary | ICD-10-CM

## 2022-06-05 DIAGNOSIS — Z6833 Body mass index (BMI) 33.0-33.9, adult: Secondary | ICD-10-CM

## 2022-06-05 DIAGNOSIS — E559 Vitamin D deficiency, unspecified: Secondary | ICD-10-CM

## 2022-06-05 DIAGNOSIS — Z131 Encounter for screening for diabetes mellitus: Secondary | ICD-10-CM

## 2022-06-05 DIAGNOSIS — M5442 Lumbago with sciatica, left side: Secondary | ICD-10-CM | POA: Diagnosis not present

## 2022-06-05 DIAGNOSIS — Z7689 Persons encountering health services in other specified circumstances: Secondary | ICD-10-CM

## 2022-06-05 DIAGNOSIS — E6609 Other obesity due to excess calories: Secondary | ICD-10-CM

## 2022-06-05 MED ORDER — PREDNISONE 10 MG (21) PO TBPK: ORAL_TABLET | ORAL | 0 refills | Status: DC

## 2022-06-05 MED ORDER — CYCLOBENZAPRINE HCL 5 MG PO TABS: 5.0000 mg | ORAL_TABLET | Freq: Three times a day (TID) | ORAL | 0 refills | Status: DC | PRN

## 2022-06-05 NOTE — Assessment & Plan Note (Signed)
Sees psychiatry currently on Adderall.  Patient reports doing much better now.

## 2022-06-05 NOTE — Progress Notes (Signed)
BP 134/86   Pulse 90   Temp 98.5 F (36.9 C) (Oral)   Resp 16   Ht 5\' 9"  (1.753 m)   Wt 217 lb 8 oz (98.7 kg)   SpO2 98%   BMI 32.12 kg/m    Subjective:    Patient ID: Scott Duke, male    DOB: [birth year], 36 y.o.   MRN: [id number]  HPI: Scott Duke is a 36 y.o. male  Chief Complaint  Patient presents with   Establish Care   Leg Pain    Left leg onset for years random sharp pain, pt states it has got better but still bothers him   Establish care: He says his last physical was long time ago.  But was seen in 2021 for establish care but provider retired. He does have a history of hyperlipidemia based on lab work.  He was unaware of this.   ADHD:  Sees psychiatrist in [place] Dr. Toy Care.  He is currently on adderall and he says this has really helped.   Hyperlipidemia.  His last triglycerides was 396 on 08/22/2020 his LDL was 117.  Discussed this with patient patient was unaware of his high cholesterol.  Discussed reducing saturated fats in his diet.  We will be getting labs today to see what how he is doing now.  Low back left sciatic pain: Patient reports that for years he has had off and off low back rating down his left leg pain.  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  He describes the pain as shooting down his left leg.  He denies any incontinence.  Discussed taking ibuprofen for pain.  We will send in steroid pack and mild muscle relaxer.  Discussed referral to orthopedics since he has had this for so long.  Patient is in agreement with plan.  Vitamin D deficiency: Patient's vitamin D on August 22, 2020 was 11.48.  Patient states he did take a vitamin D supplement.  He is not currently taking any supplementation at this time.  We will check labs.   Obesity: States he does not have the best diet.  Patient does have a physically active job.  Patient's current weight is 217 pounds with a BMI of 32.12.  Relevant past medical, surgical,  family and social history reviewed and updated as indicated. Interim medical history since our last visit reviewed. Allergies and medications reviewed and updated.  Review of Systems  Constitutional: Negative for fever or weight change.  Respiratory: Negative for cough and shortness of breath.   Cardiovascular: Negative for chest pain or palpitations.  Gastrointestinal: Negative for abdominal pain, no bowel changes.  Musculoskeletal: Negative for gait problem or joint swelling.  Positive for low back pain radiating down left leg. Skin: Negative for rash.  Neurological: Negative for dizziness or headache.  No other specific complaints in a complete review of systems (except as listed in HPI above).      Objective:    BP 134/86   Pulse 90   Temp 98.5 F (36.9 C) (Oral)   Resp 16   Ht 5\' 9"  (1.753 m)   Wt 217 lb 8 oz (98.7 kg)   SpO2 98%   BMI 32.12 kg/m   Wt Readings from Last 3 Encounters:  06/05/22 217 lb 8 oz (98.7 kg)  08/22/20 226 lb (102.5 kg)    Physical Exam  Constitutional: Patient appears well-developed and well-nourished. Obese  No distress.  HEENT: head atraumatic, normocephalic, pupils  equal and reactive to light, neck supple, Cardiovascular: Normal rate, regular rhythm and normal heart sounds.  No murmur heard. No BLE edema. Pulmonary/Chest: Effort normal and breath sounds normal. No respiratory distress. Abdominal: Soft.  There is no tenderness. Psychiatric: Patient has a normal mood and affect. behavior is normal. Judgment and thought content normal.  Results for orders placed or performed in visit on 08/22/20  CBC with Differential/Platelet  Result Value Ref Range   WBC 9.0 4.0 - 10.5 K/uL   RBC 5.25 4.22 - 5.81 Mil/uL   Hemoglobin 16.2 13.0 - 17.0 g/dL   HCT 13.2 44.0 - 10.2 %   MCV 88.7 78.0 - 100.0 fl   MCHC 34.8 30.0 - 36.0 g/dL   RDW 72.5 36.6 - 44.0 %   Platelets 302.0 150.0 - 400.0 K/uL   Neutrophils Relative % 64.0 43.0 - 77.0 %   Lymphocytes  Relative 25.3 12.0 - 46.0 %   Monocytes Relative 8.0 3.0 - 12.0 %   Eosinophils Relative 1.9 0.0 - 5.0 %   Basophils Relative 0.8 0.0 - 3.0 %   Neutro Abs 5.8 1.4 - 7.7 K/uL   Lymphs Abs 2.3 0.7 - 4.0 K/uL   Monocytes Absolute 0.7 0.1 - 1.0 K/uL   Eosinophils Absolute 0.2 0.0 - 0.7 K/uL   Basophils Absolute 0.1 0.0 - 0.1 K/uL  TSH  Result Value Ref Range   TSH 1.70 0.35 - 4.50 uIU/mL  Comprehensive metabolic panel  Result Value Ref Range   Sodium 141 135 - 145 mEq/L   Potassium 4.1 3.5 - 5.1 mEq/L   Chloride 106 96 - 112 mEq/L   CO2 26 19 - 32 mEq/L   Glucose, Bld 83 70 - 99 mg/dL   BUN 10 6 - 23 mg/dL   Creatinine, Ser 3.47 0.40 - 1.50 mg/dL   Total Bilirubin 1.0 0.2 - 1.2 mg/dL   Alkaline Phosphatase 117 39 - 117 U/L   AST 18 0 - 37 U/L   ALT 29 0 - 53 U/L   Total Protein 6.8 6.0 - 8.3 g/dL   Albumin 4.7 3.5 - 5.2 g/dL   GFR 42.59 >56.38 mL/min   Calcium 9.4 8.4 - 10.5 mg/dL  Hemoglobin V5I  Result Value Ref Range   Hgb A1c MFr Bld 5.2 4.6 - 6.5 %  Hepatitis C antibody  Result Value Ref Range   Hepatitis C Ab NON-REACTIVE NON-REACTI   SIGNAL TO CUT-OFF 0.01 <1.00  HIV Antibody (routine testing w rflx)  Result Value Ref Range   HIV 1&2 Ab, 4th Generation NON-REACTIVE NON-REACTI  Lipid panel  Result Value Ref Range   Cholesterol 209 (H) 0 - 200 mg/dL   Triglycerides 433.2 (H) 0.0 - 149.0 mg/dL   HDL 95.18 (L) >84.16 mg/dL   VLDL 60.6 (H) 0.0 - 30.1 mg/dL   Total CHOL/HDL Ratio 6    NonHDL 172.44   VITAMIN D 25 Hydroxy (Vit-D Deficiency, Fractures)  Result Value Ref Range   VITD 11.48 (L) 30.00 - 100.00 ng/mL  B12 and Folate Panel  Result Value Ref Range   Vitamin B-12 326 211 - 911 pg/mL   Folate 9.5 >5.9 ng/mL  LDL cholesterol, direct  Result Value Ref Range   Direct LDL 117.0 mg/dL      Assessment & Plan:   Problem List Items Addressed This Visit       Other   Class 1 obesity due to excess calories with body mass index (BMI) of 33.0 to 33.9 in  adult     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      Mixed hyperlipidemia - Primary    Getting labs today.  Not currently on medication.      Relevant Orders   Lipid panel   Attention deficit hyperactivity disorder (ADHD), predominantly inattentive type    Sees psychiatry currently on Adderall.  Patient reports doing much better now.      Vitamin D deficiency    Vitamin D last checked in 2021.  We will be getting labs today.  Patient is not currently taking vitamin D supplementation.      Relevant Orders   VITAMIN D 25 Hydroxy (Vit-D Deficiency, Fractures)   Other Visit Diagnoses     Acute left-sided low back pain with left-sided sciatica       Prescribed steroid pack, mild muscle relaxer.  Discussed with patient taking ibuprofen.  We will place referral to orthopedics   Relevant Medications   clonazePAM (KLONOPIN) 0.5 MG tablet   predniSONE (STERAPRED UNI-PAK 21 TAB) 10 MG (21) TBPK tablet   cyclobenzaprine (FLEXERIL) 5 MG tablet   Other Relevant Orders   Ambulatory referral to Orthopedic Surgery   Need for diphtheria-tetanus-pertussis (Tdap) vaccine       Relevant Orders   Tdap vaccine greater than or equal to 7yo IM (Completed)   Encounter to establish care       Relevant Orders   Tdap vaccine greater than or equal to 7yo IM (Completed)   CBC with Differential/Platelet   COMPLETE METABOLIC PANEL WITH GFR   Lipid panel   Hemoglobin A1c   Screening for diabetes mellitus       Relevant Orders   COMPLETE METABOLIC PANEL WITH GFR   Hemoglobin A1c   Screening for deficiency anemia       Relevant Orders   CBC with Differential/Platelet        Follow up plan: Return in about 6 months (around 12/05/2022) for follow up.

## 2022-06-05 NOTE — Assessment & Plan Note (Signed)
Getting labs today.  Not currently on medication.

## 2022-06-05 NOTE — Assessment & Plan Note (Signed)
Vitamin D last checked in 2021.  We will be getting labs today.  Patient is not currently taking vitamin D supplementation.

## 2022-06-05 NOTE — Assessment & Plan Note (Signed)
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

## 2022-06-06 LAB — CBC WITH DIFFERENTIAL/PLATELET
Absolute Monocytes: 784 cells/uL (ref 200–950)
Basophils Absolute: 49 cells/uL (ref 0–200)
Basophils Relative: 0.5 %
Eosinophils Absolute: 196 cells/uL (ref 15–500)
Eosinophils Relative: 2 %
HCT: 49.3 % (ref 38.5–50.0)
Hemoglobin: 16.9 g/dL (ref 13.2–17.1)
Lymphs Abs: 2411 cells/uL (ref 850–3900)
MCH: 30.7 pg (ref 27.0–33.0)
MCHC: 34.3 g/dL (ref 32.0–36.0)
MCV: 89.6 fL (ref 80.0–100.0)
MPV: 9.9 fL (ref 7.5–12.5)
Monocytes Relative: 8 %
Neutro Abs: 6360 cells/uL (ref 1500–7800)
Neutrophils Relative %: 64.9 %
Platelets: 334 10*3/uL (ref 140–400)
RBC: 5.5 10*6/uL (ref 4.20–5.80)
RDW: 12.4 % (ref 11.0–15.0)
Total Lymphocyte: 24.6 %
WBC: 9.8 10*3/uL (ref 3.8–10.8)

## 2022-06-06 LAB — COMPLETE METABOLIC PANEL WITH GFR
AG Ratio: 1.9 (calc) (ref 1.0–2.5)
ALT: 34 U/L (ref 9–46)
AST: 21 U/L (ref 10–40)
Albumin: 4.5 g/dL (ref 3.6–5.1)
Alkaline phosphatase (APISO): 98 U/L (ref 36–130)
BUN: 12 mg/dL (ref 7–25)
CO2: 27 mmol/L (ref 20–32)
Calcium: 9.6 mg/dL (ref 8.6–10.3)
Chloride: 106 mmol/L (ref 98–110)
Creat: 0.95 mg/dL (ref 0.60–1.26)
Globulin: 2.4 g/dL (calc) (ref 1.9–3.7)
Glucose, Bld: 80 mg/dL (ref 65–99)
Potassium: 4.5 mmol/L (ref 3.5–5.3)
Sodium: 141 mmol/L (ref 135–146)
Total Bilirubin: 0.9 mg/dL (ref 0.2–1.2)
Total Protein: 6.9 g/dL (ref 6.1–8.1)
eGFR: 108 mL/min/{1.73_m2} (ref >=60)

## 2022-06-06 LAB — HEMOGLOBIN A1C
Hgb A1c MFr Bld: 5.1 % of total Hgb (ref <5.7)
Mean Plasma Glucose: 100 mg/dL
eAG (mmol/L): 5.5 mmol/L

## 2022-06-06 LAB — LIPID PANEL
Cholesterol: 219 mg/dL — ABNORMAL HIGH (ref <200)
HDL: 46 mg/dL (ref >=40)
LDL Cholesterol (Calc): 139 mg/dL (calc) — ABNORMAL HIGH
Non-HDL Cholesterol (Calc): 173 mg/dL (calc) — ABNORMAL HIGH (ref <130)
Total CHOL/HDL Ratio: 4.8 (calc) (ref <5.0)
Triglycerides: 204 mg/dL — ABNORMAL HIGH (ref <150)

## 2022-06-06 LAB — VITAMIN D 25 HYDROXY (VIT D DEFICIENCY, FRACTURES): Vit D, 25-Hydroxy: 17 ng/mL — ABNORMAL LOW (ref 30–100)

## 2022-07-28 ENCOUNTER — Other Ambulatory Visit (HOSPITAL_COMMUNITY): Payer: Self-pay

## 2022-07-28 MED ORDER — AMPHETAMINE-DEXTROAMPHETAMINE 20 MG PO TABS
20.0000 mg | ORAL_TABLET | Freq: Three times a day (TID) | ORAL | 0 refills | Status: DC
  Filled 2022-07-28: qty 90, 30d supply, fill #0

## 2022-08-28 ENCOUNTER — Other Ambulatory Visit (HOSPITAL_COMMUNITY): Payer: Self-pay

## 2022-08-28 MED ORDER — AMPHETAMINE-DEXTROAMPHETAMINE 20 MG PO TABS
20.0000 mg | ORAL_TABLET | Freq: Three times a day (TID) | ORAL | 0 refills | Status: AC
  Filled 2022-08-28: qty 90, 30d supply, fill #0

## 2022-12-05 ENCOUNTER — Ambulatory Visit: Payer: Commercial Managed Care - PPO | Admitting: Nurse Practitioner

## 2022-12-09 DIAGNOSIS — G8929 Other chronic pain: Secondary | ICD-10-CM | POA: Insufficient documentation

## 2022-12-09 NOTE — Progress Notes (Unsigned)
There were no vitals taken for this visit.   Subjective:    Patient ID: Scott Duke, male    DOB: 07-08-1986, 37 y.o.   MRN: [id number]  HPI: Scott Duke is a 37 y.o. male  No chief complaint on file.  ADHD: Patient currently taking adderall.   Patient states this is very helpful.  Patient seen by psychiatry Dr. Toy Care. Stable no changes.   Hyperlipidemia.  His last triglycerides were 204 on 06/05/2022 and his LDL was 139.  Patient  was unaware that his cholesterol was elevated the last time I saw him.  Discussed reducing saturated fats in his diet.  Low back left sciatic pain: Patient reported that he had had low back pain radiating down his left leg for many years.  He does work in a Therapist, art things.  He describes the pain as shooting down his left leg.  Patient was instructed to take ibuprofen for pain we also gave steroid pack and mild muscle relaxer.  Patient was referred to orthopedics.  According orthopedic note patient was to start physical therapy.  Patient reports ***.  Vitamin D deficiency: Last vitamin D level was continued on 06/05/2022.  Recommended patient start over-the-counter.  Patient reports ***.   Obesity: Current weight is ***. With a BMI of ***.   Relevant past medical, surgical, family and social history reviewed and updated as indicated. Interim medical history since our last visit reviewed. Allergies and medications reviewed and updated.  Review of Systems  Constitutional: Negative for fever or weight change.  Respiratory: Negative for cough and shortness of breath.   Cardiovascular: Negative for chest pain or palpitations.  Gastrointestinal: Negative for abdominal pain, no bowel changes.  Musculoskeletal: Negative for gait problem or joint swelling.  Positive for low back pain radiating down left leg. Skin: Negative for rash.  Neurological: Negative for dizziness or headache.  No other specific complaints in a complete review  of systems (except as listed in HPI above).      Objective:    There were no vitals taken for this visit.  Wt Readings from Last 3 Encounters:  06/05/22 217 lb 8 oz (98.7 kg)  08/22/20 226 lb (102.5 kg)    Physical Exam  Constitutional: Patient appears well-developed and well-nourished. Obese  No distress.  HEENT: head atraumatic, normocephalic, pupils equal and reactive to light, neck supple, Cardiovascular: Normal rate, regular rhythm and normal heart sounds.  No murmur heard. No BLE edema. Pulmonary/Chest: Effort normal and breath sounds normal. No respiratory distress. Abdominal: Soft.  There is no tenderness. Psychiatric: Patient has a normal mood and affect. behavior is normal. Judgment and thought content normal.  Results for orders placed or performed in visit on 06/05/22  CBC with Differential/Platelet  Result Value Ref Range   WBC 9.8 3.8 - 10.8 Thousand/uL   RBC 5.50 4.20 - 5.80 Million/uL   Hemoglobin 16.9 13.2 - 17.1 g/dL   HCT 49.3 38.5 - 50.0 %   MCV 89.6 80.0 - 100.0 fL   MCH 30.7 27.0 - 33.0 pg   MCHC 34.3 32.0 - 36.0 g/dL   RDW 12.4 11.0 - 15.0 %   Platelets 334 140 - 400 Thousand/uL   MPV 9.9 7.5 - 12.5 fL   Neutro Abs 6,360 1,500 - 7,800 cells/uL   Lymphs Abs 2,411 850 - 3,900 cells/uL   Absolute Monocytes 784 200 - 950 cells/uL   Eosinophils Absolute 196 15 - 500 cells/uL   Basophils Absolute  49 0 - 200 cells/uL   Neutrophils Relative % 64.9 %   Total Lymphocyte 24.6 %   Monocytes Relative 8.0 %   Eosinophils Relative 2.0 %   Basophils Relative 0.5 %  COMPLETE METABOLIC PANEL WITH GFR  Result Value Ref Range   Glucose, Bld 80 65 - 99 mg/dL   BUN 12 7 - 25 mg/dL   Creat 0.95 0.60 - 1.26 mg/dL   eGFR 108 > OR = 60 mL/min/1.30m   BUN/Creatinine Ratio NOT APPLICABLE 6 - 22 (calc)   Sodium 141 135 - 146 mmol/L   Potassium 4.5 3.5 - 5.3 mmol/L   Chloride 106 98 - 110 mmol/L   CO2 27 20 - 32 mmol/L   Calcium 9.6 8.6 - 10.3 mg/dL   Total Protein 6.9  6.1 - 8.1 g/dL   Albumin 4.5 3.6 - 5.1 g/dL   Globulin 2.4 1.9 - 3.7 g/dL (calc)   AG Ratio 1.9 1.0 - 2.5 (calc)   Total Bilirubin 0.9 0.2 - 1.2 mg/dL   Alkaline phosphatase (APISO) 98 36 - 130 U/L   AST 21 10 - 40 U/L   ALT 34 9 - 46 U/L  Lipid panel  Result Value Ref Range   Cholesterol 219 (H) <200 mg/dL   HDL 46 > OR = 40 mg/dL   Triglycerides 204 (H) <150 mg/dL   LDL Cholesterol (Calc) 139 (H) mg/dL (calc)   Total CHOL/HDL Ratio 4.8 <5.0 (calc)   Non-HDL Cholesterol (Calc) 173 (H) <130 mg/dL (calc)  Hemoglobin A1c  Result Value Ref Range   Hgb A1c MFr Bld 5.1 <5.7 % of total Hgb   Mean Plasma Glucose 100 mg/dL   eAG (mmol/L) 5.5 mmol/L  VITAMIN D 25 Hydroxy (Vit-D Deficiency, Fractures)  Result Value Ref Range   Vit D, 25-Hydroxy 17 (L) 30 - 100 ng/mL      Assessment & Plan:   Problem List Items Addressed This Visit   None    Follow up plan: No follow-ups on file.

## 2022-12-10 ENCOUNTER — Encounter: Payer: Self-pay | Admitting: Nurse Practitioner

## 2022-12-10 ENCOUNTER — Other Ambulatory Visit: Payer: Self-pay

## 2022-12-10 ENCOUNTER — Ambulatory Visit (INDEPENDENT_AMBULATORY_CARE_PROVIDER_SITE_OTHER): Payer: Commercial Managed Care - PPO | Admitting: Nurse Practitioner

## 2022-12-10 VITALS — BP 122/84 | HR 100 | Temp 97.9°F | Resp 18 | Ht 69.0 in | Wt 230.2 lb

## 2022-12-10 DIAGNOSIS — M5442 Lumbago with sciatica, left side: Secondary | ICD-10-CM

## 2022-12-10 DIAGNOSIS — Z23 Encounter for immunization: Secondary | ICD-10-CM

## 2022-12-10 DIAGNOSIS — E559 Vitamin D deficiency, unspecified: Secondary | ICD-10-CM

## 2022-12-10 DIAGNOSIS — G8929 Other chronic pain: Secondary | ICD-10-CM

## 2022-12-10 DIAGNOSIS — E782 Mixed hyperlipidemia: Secondary | ICD-10-CM

## 2022-12-10 DIAGNOSIS — Z6833 Body mass index (BMI) 33.0-33.9, adult: Secondary | ICD-10-CM

## 2022-12-10 DIAGNOSIS — Z131 Encounter for screening for diabetes mellitus: Secondary | ICD-10-CM

## 2022-12-10 DIAGNOSIS — Z130 Encounter for screening for diseases of the blood and blood-forming organs and certain disorders involving the immune mechanism: Secondary | ICD-10-CM

## 2022-12-10 DIAGNOSIS — F900 Attention-deficit hyperactivity disorder, predominantly inattentive type: Secondary | ICD-10-CM

## 2022-12-10 DIAGNOSIS — E6609 Other obesity due to excess calories: Secondary | ICD-10-CM

## 2022-12-10 LAB — CBC WITH DIFFERENTIAL/PLATELET
Absolute Monocytes: 866 cells/uL (ref 200–950)
Basophils Absolute: 68 cells/uL (ref 0–200)
Basophils Relative: 0.6 %
Eosinophils Absolute: 262 cells/uL (ref 15–500)
Eosinophils Relative: 2.3 %
HCT: 47.5 % (ref 38.5–50.0)
Hemoglobin: 16.7 g/dL (ref 13.2–17.1)
Lymphs Abs: 2269 cells/uL (ref 850–3900)
MCH: 31 pg (ref 27.0–33.0)
MCHC: 35.2 g/dL (ref 32.0–36.0)
MCV: 88.3 fL (ref 80.0–100.0)
MPV: 10.2 fL (ref 7.5–12.5)
Monocytes Relative: 7.6 %
Neutro Abs: 7934 cells/uL — ABNORMAL HIGH (ref 1500–7800)
Neutrophils Relative %: 69.6 %
Platelets: 352 10*3/uL (ref 140–400)
RBC: 5.38 10*6/uL (ref 4.20–5.80)
RDW: 12.4 % (ref 11.0–15.0)
Total Lymphocyte: 19.9 %
WBC: 11.4 10*3/uL — ABNORMAL HIGH (ref 3.8–10.8)

## 2022-12-10 LAB — COMPLETE METABOLIC PANEL WITH GFR
AG Ratio: 2 (calc) (ref 1.0–2.5)
ALT: 36 U/L (ref 9–46)
AST: 24 U/L (ref 10–40)
Albumin: 4.4 g/dL (ref 3.6–5.1)
Alkaline phosphatase (APISO): 111 U/L (ref 36–130)
BUN: 13 mg/dL (ref 7–25)
CO2: 28 mmol/L (ref 20–32)
Calcium: 9.2 mg/dL (ref 8.6–10.3)
Chloride: 106 mmol/L (ref 98–110)
Creat: 0.98 mg/dL (ref 0.60–1.26)
Globulin: 2.2 g/dL (calc) (ref 1.9–3.7)
Glucose, Bld: 85 mg/dL (ref 65–99)
Potassium: 4.2 mmol/L (ref 3.5–5.3)
Sodium: 141 mmol/L (ref 135–146)
Total Bilirubin: 0.9 mg/dL (ref 0.2–1.2)
Total Protein: 6.6 g/dL (ref 6.1–8.1)
eGFR: 104 mL/min/{1.73_m2} (ref >=60)

## 2022-12-10 LAB — LIPID PANEL
Cholesterol: 206 mg/dL — ABNORMAL HIGH (ref <200)
HDL: 40 mg/dL (ref >=40)
Non-HDL Cholesterol (Calc): 166 mg/dL (calc) — ABNORMAL HIGH (ref <130)
Total CHOL/HDL Ratio: 5.2 (calc) — ABNORMAL HIGH (ref <5.0)
Triglycerides: 530 mg/dL — ABNORMAL HIGH (ref <150)

## 2022-12-10 MED ORDER — VITAMIN D3 50 MCG (2000 UT) PO CAPS: 2000.0000 [IU] | ORAL_CAPSULE | Freq: Every day | ORAL | 6 refills | Status: AC

## 2022-12-10 NOTE — Assessment & Plan Note (Signed)
Work on doing back strengthening exercises.  Can take tylenol or ibuprofen for pain

## 2022-12-10 NOTE — Assessment & Plan Note (Signed)
Continue working on lifestyle modification 

## 2022-12-10 NOTE — Assessment & Plan Note (Signed)
Continue current treatment plan, keep appointments with psychiatry

## 2022-12-10 NOTE — Assessment & Plan Note (Signed)
Start vitamin d supplementation

## 2022-12-11 ENCOUNTER — Other Ambulatory Visit: Payer: Self-pay | Admitting: Nurse Practitioner

## 2022-12-11 DIAGNOSIS — E782 Mixed hyperlipidemia: Secondary | ICD-10-CM

## 2022-12-11 MED ORDER — EZETIMIBE 10 MG PO TABS: 10.0000 mg | ORAL_TABLET | Freq: Every day | ORAL | 3 refills | Status: AC

## 2023-01-30 ENCOUNTER — Other Ambulatory Visit (HOSPITAL_COMMUNITY): Payer: Self-pay

## 2023-01-30 MED ORDER — AMPHETAMINE-DEXTROAMPHETAMINE 20 MG PO TABS
20.0000 mg | ORAL_TABLET | Freq: Three times a day (TID) | ORAL | 0 refills | Status: DC
  Filled 2023-01-30: qty 90, 30d supply, fill #0

## 2023-02-11 ENCOUNTER — Other Ambulatory Visit: Payer: Self-pay

## 2023-03-02 ENCOUNTER — Other Ambulatory Visit (HOSPITAL_COMMUNITY): Payer: Self-pay

## 2023-03-02 MED ORDER — AMPHETAMINE-DEXTROAMPHETAMINE 20 MG PO TABS
20.0000 mg | ORAL_TABLET | Freq: Three times a day (TID) | ORAL | 0 refills | Status: AC
  Filled 2023-03-02: qty 90, 30d supply, fill #0

## 2023-03-31 ENCOUNTER — Other Ambulatory Visit (HOSPITAL_COMMUNITY): Payer: Self-pay

## 2023-03-31 MED ORDER — AMPHETAMINE-DEXTROAMPHETAMINE 20 MG PO TABS
20.0000 mg | ORAL_TABLET | Freq: Three times a day (TID) | ORAL | 0 refills | Status: DC
  Filled 2023-04-01: qty 90, 30d supply, fill #0

## 2023-04-01 ENCOUNTER — Other Ambulatory Visit (HOSPITAL_COMMUNITY): Payer: Self-pay

## 2023-05-01 ENCOUNTER — Other Ambulatory Visit (HOSPITAL_COMMUNITY): Payer: Self-pay

## 2023-05-01 MED ORDER — AMPHETAMINE-DEXTROAMPHETAMINE 20 MG PO TABS
20.0000 mg | ORAL_TABLET | Freq: Three times a day (TID) | ORAL | 0 refills | Status: DC
  Filled 2023-05-01: qty 90, 30d supply, fill #0

## 2023-06-04 ENCOUNTER — Other Ambulatory Visit (HOSPITAL_COMMUNITY): Payer: Self-pay

## 2023-06-15 ENCOUNTER — Ambulatory Visit: Payer: Commercial Managed Care - PPO | Admitting: Nurse Practitioner

## 2023-07-01 ENCOUNTER — Other Ambulatory Visit (HOSPITAL_COMMUNITY): Payer: Self-pay

## 2023-07-01 MED ORDER — AMPHETAMINE-DEXTROAMPHETAMINE 20 MG PO TABS
20.0000 mg | ORAL_TABLET | Freq: Three times a day (TID) | ORAL | 0 refills | Status: DC
  Filled 2023-07-01 – 2023-07-02 (×2): qty 90, 30d supply, fill #0

## 2023-07-02 ENCOUNTER — Other Ambulatory Visit (HOSPITAL_COMMUNITY): Payer: Self-pay

## 2023-07-02 ENCOUNTER — Other Ambulatory Visit: Payer: Self-pay

## 2023-08-31 ENCOUNTER — Other Ambulatory Visit: Payer: Self-pay

## 2023-08-31 ENCOUNTER — Other Ambulatory Visit (HOSPITAL_COMMUNITY): Payer: Self-pay

## 2023-08-31 MED ORDER — AMPHETAMINE-DEXTROAMPHETAMINE 20 MG PO TABS
20.0000 mg | ORAL_TABLET | Freq: Three times a day (TID) | ORAL | 0 refills | Status: DC
  Filled 2023-08-31: qty 90, 30d supply, fill #0

## 2023-10-01 ENCOUNTER — Other Ambulatory Visit (HOSPITAL_COMMUNITY): Payer: Self-pay

## 2023-10-01 ENCOUNTER — Other Ambulatory Visit: Payer: Self-pay

## 2023-10-01 MED ORDER — AMPHETAMINE-DEXTROAMPHETAMINE 20 MG PO TABS
20.0000 mg | ORAL_TABLET | Freq: Three times a day (TID) | ORAL | 0 refills | Status: DC
  Filled 2023-10-01: qty 90, 30d supply, fill #0

## 2023-10-02 ENCOUNTER — Encounter (HOSPITAL_COMMUNITY): Payer: Self-pay

## 2023-10-02 ENCOUNTER — Other Ambulatory Visit (HOSPITAL_COMMUNITY): Payer: Self-pay

## 2023-11-02 ENCOUNTER — Other Ambulatory Visit (HOSPITAL_COMMUNITY): Payer: Self-pay

## 2023-12-03 ENCOUNTER — Other Ambulatory Visit (HOSPITAL_COMMUNITY): Payer: Self-pay

## 2023-12-03 MED ORDER — AMPHETAMINE-DEXTROAMPHETAMINE 20 MG PO TABS
20.0000 mg | ORAL_TABLET | Freq: Three times a day (TID) | ORAL | 0 refills | Status: AC
  Filled 2023-12-03: qty 90, 30d supply, fill #0

## 2024-02-03 ENCOUNTER — Ambulatory Visit: Payer: Self-pay | Admitting: Nurse Practitioner

## 2024-02-03 ENCOUNTER — Ambulatory Visit: Payer: Commercial Managed Care - PPO | Admitting: Nurse Practitioner

## 2024-02-03 VITALS — BP 132/84 | HR 88 | Temp 97.6°F | Resp 16 | Ht 69.0 in | Wt 229.2 lb

## 2024-02-03 DIAGNOSIS — J019 Acute sinusitis, unspecified: Secondary | ICD-10-CM | POA: Diagnosis not present

## 2024-02-03 DIAGNOSIS — H60311 Diffuse otitis externa, right ear: Secondary | ICD-10-CM | POA: Diagnosis not present

## 2024-02-03 MED ORDER — AMOXICILLIN-POT CLAVULANATE 875-125 MG PO TABS
1.0000 | ORAL_TABLET | Freq: Two times a day (BID) | ORAL | 0 refills | Status: AC
Start: 2024-02-03 — End: ?

## 2024-02-03 NOTE — Telephone Encounter (Signed)
 Copied from CRM [id number]. Topic: Clinical - Red Word Triage >> Feb 03, 2024  8:09 AM Elle L wrote: Red Word that prompted transfer to Nurse Triage: The patient's wife states that the patient has had sinus issues but has started having severe ear pain that is causing dizziness.  Chief Complaint: earache Symptoms: dizziness Frequency: 1 week Pertinent Negatives: Patient denies discharge, fever Disposition: [] ED /[] Urgent Care (no appt availability in office) / [x] Appointment(In office/virtual)/ []  [facility]/ [] Home Care/ [] Refused Recommended Disposition /[]  Mobile Bus/ []  Follow-up with PCP Additional Notes:   Reason for Disposition  Earache  (Exceptions: brief ear pain of < 60 minutes duration, earache occurring during air travel  Answer Assessment - Initial Assessment Questions 1. LOCATION: "Which ear is involved?"     right 2. ONSET: "When did the ear start hurting"      1 week 3. SEVERITY: "How bad is the pain?"  (Scale 1-10; mild, moderate or severe)   - MILD (1-3): doesn't interfere with normal activities    - MODERATE (4-7): interferes with normal activities or awakens from sleep    - SEVERE (8-10): excruciating pain, unable to do any normal activities      8/10 4. URI SYMPTOMS: "Do you have a runny nose or cough?"     Sinusitis cough 5. FEVER: "Do you have a fever?" If Yes, ask: "What is your temperature, how was it measured, and when did it start?"     no 6. CAUSE: "Have you been swimming recently?", "How often do you use Q-TIPS?", "Have you had any recent air travel or scuba diving?"     N/a 7. OTHER SYMPTOMS: "Do you have any other symptoms?" (e.g., headache, stiff neck, dizziness, vomiting, runny nose, decreased hearing)     Dizziness (worse with head turning and standing), decreased hearing, headaches 8. PREGNANCY: "Is there any chance you are pregnant?" "When was your last menstrual period?"     N/a  Protocols used: Davina Poke

## 2024-02-03 NOTE — Progress Notes (Signed)
 BP 132/84   Pulse 88   Temp 97.6 F (36.4 C)   Resp 16   Ht 5\' 9"  (1.753 m)   Wt 229 lb 3.2 oz (104 kg)   SpO2 96%   BMI 33.85 kg/m    Subjective:    Patient ID: Scott Duke, male    DOB: [birth year], 38 y.o.   MRN: [id number]  HPI: Scott Duke is a 38 y.o. male  Chief Complaint  Patient presents with   Ear Fullness    W/ dizziness and sinus congestion  Patient presents with complaints of sinus pressure, ear fullness, dizziness, cough, mild HA, and nasal congestion x 1 week. Patient also reports reduced hearing in right ear.  He denies any fever, shortness of breath. He reports he has been taking nasal spray and antibiotic ear drops he got from a virtual provider.        12/10/2022    8:40 AM 06/05/2022   10:48 AM 08/22/2020   10:39 AM  Depression screen PHQ 2/9  Decreased Interest 0 0 0  Down, Depressed, Hopeless 0 0 0  PHQ - 2 Score 0 0 0  Altered sleeping  0 1  Tired, decreased energy  0 1  Change in appetite  0 0  Feeling bad or failure about yourself   0 0  Trouble concentrating  0 0  Moving slowly or fidgety/restless  0 0  Suicidal thoughts  0 0  PHQ-9 Score  0 2  Difficult doing work/chores  Not difficult at all Somewhat difficult    Relevant past medical, surgical, family and social history reviewed and updated as indicated. Interim medical history since our last visit reviewed. Allergies and medications reviewed and updated.  Review of Systems Constitutional: Negative for fever or weight change.  HEENT:  positive for right ear fullness,  nasal congestion, negative for sore throat Respiratory: Reports cough, but denies shortness of breath.   Cardiovascular: Negative for chest pain or palpitations.  Gastrointestinal: Negative for abdominal pain, no bowel changes.  Musculoskeletal: Negative for gait problem or joint swelling.  Skin: Negative for rash.  Neurological: Reports dizziness and mild headache.  No other specific complaints in a complete  review of systems (except as listed in HPI above).  Per HPI unless specifically indicated above     Objective:    BP 132/84   Pulse 88   Temp 97.6 F (36.4 C)   Resp 16   Ht 5\' 9"  (1.753 m)   Wt 229 lb 3.2 oz (104 kg)   SpO2 96%   BMI 33.85 kg/m    Wt Readings from Last 3 Encounters:  02/03/24 229 lb 3.2 oz (104 kg)  12/10/22 230 lb 3.2 oz (104.4 kg)  06/05/22 217 lb 8 oz (98.7 kg)    Constitutional: Patient appears well-developed and well-nourished. No distress.  HEENT: head atraumatic, normocephalic, pupils equal and reactive to light, right ear is red and swollen, TMs intact, no bulging or redness noted. neck supple, throat within normal limits, lymphadenopathy present Cardiovascular: Normal rate, regular rhythm and normal heart sounds.  No murmur heard. No BLE edema. Pulmonary/Chest: Effort normal and breath sounds normal. No respiratory distress. Neuro:  stable gait,  equal grip no arm drift.  Psychiatric: Patient has a normal mood and affect. behavior is normal. Judgment and thought content normal.   Results for orders placed or performed in visit on 12/10/22  Lipid panel   Collection Time: 12/10/22  9:13 AM  Result Value  Ref Range   Cholesterol 206 (H) <200 mg/dL   HDL 40 > OR = 40 mg/dL   Triglycerides 409 (H) <150 mg/dL   LDL Cholesterol (Calc)  mg/dL (calc)   Total CHOL/HDL Ratio 5.2 (H) <5.0 (calc)   Non-HDL Cholesterol (Calc) 166 (H) <130 mg/dL (calc)  CBC with Differential/Platelet   Collection Time: 12/10/22  9:13 AM  Result Value Ref Range   WBC 11.4 (H) 3.8 - 10.8 Thousand/uL   RBC 5.38 4.20 - 5.80 Million/uL   Hemoglobin 16.7 13.2 - 17.1 g/dL   HCT 81.1 91.4 - 78.2 %   MCV 88.3 80.0 - 100.0 fL   MCH 31.0 27.0 - 33.0 pg   MCHC 35.2 32.0 - 36.0 g/dL   RDW 95.6 21.3 - 08.6 %   Platelets 352 140 - 400 Thousand/uL   MPV 10.2 7.5 - 12.5 fL   Neutro Abs 7,934 (H) 1,500 - 7,800 cells/uL   Lymphs Abs 2,269 850 - 3,900 cells/uL   Absolute Monocytes 866 200  - 950 cells/uL   Eosinophils Absolute 262 15 - 500 cells/uL   Basophils Absolute 68 0 - 200 cells/uL   Neutrophils Relative % 69.6 %   Total Lymphocyte 19.9 %   Monocytes Relative 7.6 %   Eosinophils Relative 2.3 %   Basophils Relative 0.6 %  COMPLETE METABOLIC PANEL WITH GFR   Collection Time: 12/10/22  9:13 AM  Result Value Ref Range   Glucose, Bld 85 65 - 99 mg/dL   BUN 13 7 - 25 mg/dL   Creat 5.78 4.69 - 6.29 mg/dL   eGFR 528 > OR = 60 UX/LKG/4.01U2   BUN/Creatinine Ratio SEE NOTE: 6 - 22 (calc)   Sodium 141 135 - 146 mmol/L   Potassium 4.2 3.5 - 5.3 mmol/L   Chloride 106 98 - 110 mmol/L   CO2 28 20 - 32 mmol/L   Calcium 9.2 8.6 - 10.3 mg/dL   Total Protein 6.6 6.1 - 8.1 g/dL   Albumin 4.4 3.6 - 5.1 g/dL   Globulin 2.2 1.9 - 3.7 g/dL (calc)   AG Ratio 2.0 1.0 - 2.5 (calc)   Total Bilirubin 0.9 0.2 - 1.2 mg/dL   Alkaline phosphatase (APISO) 111 36 - 130 U/L   AST 24 10 - 40 U/L   ALT 36 9 - 46 U/L       Assessment & Plan:   Problem List Items Addressed This Visit   None Visit Diagnoses       Acute non-recurrent sinusitis, unspecified location    -  Primary   Prescribed Augmentin BID x 10 days for tx d/t presenting symptoms and findings on physical exam   Relevant Medications   amoxicillin-clavulanate (AUGMENTIN) 875-125 MG tablet     Acute diffuse otitis externa of right ear       Relevant Medications   amoxicillin-clavulanate (AUGMENTIN) 875-125 MG tablet      Recommend taking zyrtec, flonase, mucinex, vitamin c, d and zinc.  Push fluids and get rest.      Follow up plan: Return if symptoms worsen or fail to improve.

## 2024-04-02 ENCOUNTER — Other Ambulatory Visit: Payer: Self-pay | Admitting: Medical Genetics

## 2024-04-04 ENCOUNTER — Other Ambulatory Visit (HOSPITAL_COMMUNITY): Payer: Self-pay

## 2024-04-04 MED ORDER — AMPHETAMINE-DEXTROAMPHETAMINE 20 MG PO TABS
20.0000 mg | ORAL_TABLET | Freq: Three times a day (TID) | ORAL | 0 refills | Status: DC
Start: 2024-04-04 — End: 2024-05-04
  Filled 2024-04-04: qty 90, 30d supply, fill #0

## 2024-04-05 ENCOUNTER — Other Ambulatory Visit: Payer: Self-pay

## 2024-05-04 ENCOUNTER — Other Ambulatory Visit (HOSPITAL_COMMUNITY): Payer: Self-pay

## 2024-05-04 MED ORDER — AMPHETAMINE-DEXTROAMPHETAMINE 20 MG PO TABS
20.0000 mg | ORAL_TABLET | Freq: Three times a day (TID) | ORAL | 0 refills | Status: AC
  Filled 2024-05-04: qty 90, 30d supply, fill #0

## 2024-06-01 ENCOUNTER — Other Ambulatory Visit (HOSPITAL_COMMUNITY): Payer: Self-pay

## 2024-06-01 MED ORDER — AMPHETAMINE-DEXTROAMPHETAMINE 20 MG PO TABS
20.0000 mg | ORAL_TABLET | Freq: Three times a day (TID) | ORAL | 0 refills | Status: AC
  Filled 2024-06-03: qty 90, 30d supply, fill #0

## 2024-06-01 MED ORDER — AMPHETAMINE-DEXTROAMPHETAMINE 20 MG PO TABS
20.0000 mg | ORAL_TABLET | Freq: Three times a day (TID) | ORAL | 0 refills | Status: AC
  Filled 2024-07-03: qty 90, 30d supply, fill #0

## 2024-06-01 MED ORDER — AMPHETAMINE-DEXTROAMPHETAMINE 20 MG PO TABS
20.0000 mg | ORAL_TABLET | Freq: Three times a day (TID) | ORAL | 0 refills | Status: AC
  Filled 2024-08-03: qty 90, 30d supply, fill #0

## 2024-06-03 ENCOUNTER — Encounter (HOSPITAL_COMMUNITY): Payer: Self-pay

## 2024-06-03 ENCOUNTER — Other Ambulatory Visit: Payer: Self-pay

## 2024-06-03 ENCOUNTER — Other Ambulatory Visit (HOSPITAL_COMMUNITY): Payer: Self-pay

## 2024-07-04 ENCOUNTER — Other Ambulatory Visit (HOSPITAL_COMMUNITY): Payer: Self-pay

## 2024-08-03 ENCOUNTER — Other Ambulatory Visit (HOSPITAL_COMMUNITY): Payer: Self-pay

## 2024-08-03 ENCOUNTER — Other Ambulatory Visit: Payer: Self-pay

## 2024-09-05 ENCOUNTER — Other Ambulatory Visit (HOSPITAL_COMMUNITY): Payer: Self-pay

## 2024-09-05 MED ORDER — AMPHETAMINE-DEXTROAMPHETAMINE 20 MG PO TABS
20.0000 mg | ORAL_TABLET | Freq: Three times a day (TID) | ORAL | 0 refills | Status: DC
  Filled 2024-10-05: qty 90, 30d supply, fill #0

## 2024-09-05 MED ORDER — AMPHETAMINE-DEXTROAMPHETAMINE 20 MG PO TABS
20.0000 mg | ORAL_TABLET | Freq: Three times a day (TID) | ORAL | 0 refills | Status: AC
  Filled 2024-09-05: qty 90, 30d supply, fill #0

## 2024-09-05 MED ORDER — AMPHETAMINE-DEXTROAMPHETAMINE 20 MG PO TABS
20.0000 mg | ORAL_TABLET | Freq: Three times a day (TID) | ORAL | 0 refills | Status: DC
  Filled 2024-11-04: qty 90, 30d supply, fill #0

## 2024-09-16 ENCOUNTER — Other Ambulatory Visit: Payer: Self-pay | Admitting: Medical Genetics

## 2024-09-16 DIAGNOSIS — Z006 Encounter for examination for normal comparison and control in clinical research program: Secondary | ICD-10-CM

## 2024-10-05 ENCOUNTER — Other Ambulatory Visit (HOSPITAL_COMMUNITY): Payer: Self-pay

## 2024-10-05 ENCOUNTER — Other Ambulatory Visit: Payer: Self-pay

## 2024-10-15 LAB — GENECONNECT MOLECULAR SCREEN: Genetic Analysis Overall Interpretation: NEGATIVE

## 2024-11-04 ENCOUNTER — Other Ambulatory Visit (HOSPITAL_COMMUNITY): Payer: Self-pay

## 2024-11-24 ENCOUNTER — Other Ambulatory Visit (HOSPITAL_COMMUNITY): Payer: Self-pay

## 2024-11-24 MED ORDER — GUANFACINE HCL ER 2 MG PO TB24
2.0000 mg | ORAL_TABLET | Freq: Every morning | ORAL | 1 refills | Status: AC
  Filled 2024-11-24: qty 90, 90d supply, fill #0

## 2024-12-05 ENCOUNTER — Other Ambulatory Visit (HOSPITAL_COMMUNITY): Payer: Self-pay

## 2024-12-05 MED ORDER — AMPHETAMINE-DEXTROAMPHETAMINE 20 MG PO TABS
20.0000 mg | ORAL_TABLET | Freq: Three times a day (TID) | ORAL | 0 refills | Status: AC
  Filled 2025-01-06: qty 90, 30d supply, fill #0

## 2024-12-05 MED ORDER — AMPHETAMINE-DEXTROAMPHETAMINE 20 MG PO TABS
20.0000 mg | ORAL_TABLET | Freq: Three times a day (TID) | ORAL | 0 refills | Status: AC
  Filled 2024-12-05: qty 90, 30d supply, fill #0

## 2024-12-05 MED ORDER — AMPHETAMINE-DEXTROAMPHETAMINE 20 MG PO TABS: 20.0000 mg | ORAL_TABLET | Freq: Three times a day (TID) | ORAL | 0 refills | Status: AC

## 2025-01-05 ENCOUNTER — Encounter (HOSPITAL_COMMUNITY): Payer: Self-pay

## 2025-01-05 ENCOUNTER — Other Ambulatory Visit (HOSPITAL_COMMUNITY): Payer: Self-pay

## 2025-01-06 ENCOUNTER — Other Ambulatory Visit (HOSPITAL_COMMUNITY): Payer: Self-pay
